# Patient Record
Sex: Female | Born: 1965 | Race: Black or African American | Hispanic: No | Smoking: Never smoker
Health system: Southern US, Community
[De-identification: ages and names within clinical notes are randomized; demographics above are authoritative.]

## PROBLEM LIST (undated history)

## (undated) DIAGNOSIS — F329 Major depressive disorder, single episode, unspecified: Secondary | ICD-10-CM

## (undated) DIAGNOSIS — M1990 Unspecified osteoarthritis, unspecified site: Secondary | ICD-10-CM

## (undated) DIAGNOSIS — E119 Type 2 diabetes mellitus without complications: Secondary | ICD-10-CM

## (undated) DIAGNOSIS — F419 Anxiety disorder, unspecified: Secondary | ICD-10-CM

## (undated) DIAGNOSIS — F32A Depression, unspecified: Secondary | ICD-10-CM

## (undated) HISTORY — DX: Unspecified osteoarthritis, unspecified site: M19.90

## (undated) HISTORY — DX: Anxiety disorder, unspecified: F41.9

## (undated) HISTORY — DX: Depression, unspecified: F32.A

## (undated) HISTORY — DX: Type 2 diabetes mellitus without complications: E11.9

## (undated) HISTORY — DX: Major depressive disorder, single episode, unspecified: F32.9

---

## 2004-10-15 ENCOUNTER — Other Ambulatory Visit: Admission: RE | Admit: 2004-10-15 | Discharge: 2004-10-15 | Payer: Self-pay | Admitting: Gynecology

## 2005-01-24 ENCOUNTER — Ambulatory Visit: Payer: Self-pay | Admitting: Obstetrics and Gynecology

## 2005-01-24 ENCOUNTER — Inpatient Hospital Stay (HOSPITAL_COMMUNITY): Admission: AD | Admit: 2005-01-24 | Discharge: 2005-01-24 | Payer: Self-pay | Admitting: Gynecology

## 2005-03-02 ENCOUNTER — Other Ambulatory Visit: Admission: RE | Admit: 2005-03-02 | Discharge: 2005-03-02 | Payer: Self-pay | Admitting: Gynecology

## 2005-05-30 ENCOUNTER — Encounter: Admission: RE | Admit: 2005-05-30 | Discharge: 2005-08-28 | Payer: Self-pay | Admitting: Gynecology

## 2005-09-01 ENCOUNTER — Inpatient Hospital Stay (HOSPITAL_COMMUNITY): Admission: AD | Admit: 2005-09-01 | Discharge: 2005-09-04 | Payer: Self-pay | Admitting: Gynecology

## 2005-09-02 ENCOUNTER — Encounter (INDEPENDENT_AMBULATORY_CARE_PROVIDER_SITE_OTHER): Payer: Self-pay | Admitting: *Deleted

## 2005-09-05 ENCOUNTER — Encounter: Admission: RE | Admit: 2005-09-05 | Discharge: 2005-10-05 | Payer: Self-pay | Admitting: Gynecology

## 2005-10-06 ENCOUNTER — Encounter: Admission: RE | Admit: 2005-10-06 | Discharge: 2005-11-04 | Payer: Self-pay | Admitting: Gynecology

## 2005-10-12 ENCOUNTER — Other Ambulatory Visit: Admission: RE | Admit: 2005-10-12 | Discharge: 2005-10-12 | Payer: Self-pay | Admitting: Gynecology

## 2005-11-05 ENCOUNTER — Encounter: Admission: RE | Admit: 2005-11-05 | Discharge: 2005-12-05 | Payer: Self-pay | Admitting: Gynecology

## 2005-12-06 ENCOUNTER — Encounter: Admission: RE | Admit: 2005-12-06 | Discharge: 2005-12-08 | Payer: Self-pay | Admitting: Gynecology

## 2006-10-16 ENCOUNTER — Other Ambulatory Visit: Admission: RE | Admit: 2006-10-16 | Discharge: 2006-10-16 | Payer: Self-pay | Admitting: Gynecology

## 2007-10-17 ENCOUNTER — Other Ambulatory Visit: Admission: RE | Admit: 2007-10-17 | Discharge: 2007-10-17 | Payer: Self-pay | Admitting: Gynecology

## 2008-01-12 ENCOUNTER — Emergency Department (HOSPITAL_COMMUNITY): Admission: EM | Admit: 2008-01-12 | Discharge: 2008-01-12 | Payer: Self-pay | Admitting: Emergency Medicine

## 2008-06-03 ENCOUNTER — Encounter: Admission: RE | Admit: 2008-06-03 | Discharge: 2008-06-03 | Payer: Self-pay | Admitting: Gynecology

## 2008-06-05 ENCOUNTER — Encounter: Admission: RE | Admit: 2008-06-05 | Discharge: 2008-06-05 | Payer: Self-pay | Admitting: Gynecology

## 2008-06-23 ENCOUNTER — Emergency Department (HOSPITAL_COMMUNITY): Admission: EM | Admit: 2008-06-23 | Discharge: 2008-06-23 | Payer: Self-pay | Admitting: Family Medicine

## 2009-01-15 ENCOUNTER — Ambulatory Visit: Payer: Self-pay | Admitting: Gynecology

## 2009-01-15 ENCOUNTER — Other Ambulatory Visit: Admission: RE | Admit: 2009-01-15 | Discharge: 2009-01-15 | Payer: Self-pay | Admitting: Gynecology

## 2009-01-15 ENCOUNTER — Encounter: Payer: Self-pay | Admitting: Gynecology

## 2009-03-11 ENCOUNTER — Ambulatory Visit: Payer: Self-pay | Admitting: Gynecology

## 2010-01-15 ENCOUNTER — Other Ambulatory Visit: Admission: RE | Admit: 2010-01-15 | Discharge: 2010-01-15 | Payer: Self-pay | Admitting: Gynecology

## 2010-01-15 ENCOUNTER — Ambulatory Visit: Payer: Self-pay | Admitting: Gynecology

## 2010-05-09 ENCOUNTER — Encounter: Payer: Self-pay | Admitting: Gynecology

## 2010-07-05 ENCOUNTER — Ambulatory Visit (INDEPENDENT_AMBULATORY_CARE_PROVIDER_SITE_OTHER): Payer: BC Managed Care – PPO | Admitting: Gynecology

## 2010-07-05 DIAGNOSIS — L259 Unspecified contact dermatitis, unspecified cause: Secondary | ICD-10-CM

## 2010-07-15 ENCOUNTER — Ambulatory Visit (INDEPENDENT_AMBULATORY_CARE_PROVIDER_SITE_OTHER): Payer: BC Managed Care – PPO | Admitting: Gynecology

## 2010-07-15 DIAGNOSIS — L259 Unspecified contact dermatitis, unspecified cause: Secondary | ICD-10-CM

## 2010-07-19 ENCOUNTER — Ambulatory Visit: Payer: BC Managed Care – PPO | Admitting: Gynecology

## 2010-09-03 NOTE — H&P (Signed)
NAME:  Debbie Williams, Debbie Williams   ACCOUNT NO.:  [id number]   MEDICAL RECORD NO.:  [id number]          PATIENT TYPE:  MAT   LOCATION:  [facility]                          FACILITY:  [facility]   PHYSICIAN:  Timothy P. Fontaine, M.D.DATE OF BIRTH:  [birth year]   DATE OF ADMISSION:  DATE OF DISCHARGE:                                HISTORY & PHYSICAL   CHIEF COMPLAINT:  1.  Pregnancy at 38 weeks.  2.  Gestational diabetes.  3.  Marginal low AFI.   HISTORY OF PRESENT ILLNESS:  [name], [name] at [redacted] weeks  gestation, being followed for gestational diabetes. Good glucose control.  Initially had polyhydramnios during the pregnancy and now has marginal  oligohydramnios over the past 2 weeks. She has been treated with bedrest and  fluid hydration. She is admitted at this time for induction due to her  advancing pregnancy and marginal AFI at 8 cm, prior polyhydramnios, and her  gestational diabetes. For the remainder of her history, see her [facility].   PHYSICAL EXAMINATION:  HEENT:  Normal.  LUNGS:  Clear.  CARDIAC:  Regular rate. No murmur, rub, or gallop.  ABDOMEN:  Gravid, vertex fetus consistent with term. Positive fetal heart  tones.  PELVIC:  Cervix is closed, 50%, minus 2 station, vertex presentation.   ASSESSMENT/PLAN:  [name], P0 with gestation diabetes, initially  with polyhydramnios, now with marginal oligohydramnios despite bedrest,  fluid hydration, for induction. She is beta strep negative. The risks,  benefits, indications, and alternatives for the induction were reviewed with  her. Given the unfavorable status, the issues of possible cesarean was also  reviewed. The patient strongly desires induction and is very nervous about  prolonging her pregnancy in anticipation of waiting for a favorable cervix  or spontaneous labor and she is admitted at this time for Cervidil in the  p.m. with pitocin in the a.m.      Timothy P. Fontaine, M.D.  Electronically  Signed     TPF/MEDQ  D:  09/01/2005  T:  09/01/2005  Job:  [id number]

## 2010-09-20 ENCOUNTER — Ambulatory Visit (INDEPENDENT_AMBULATORY_CARE_PROVIDER_SITE_OTHER): Payer: BC Managed Care – PPO | Admitting: Gynecology

## 2010-09-20 DIAGNOSIS — N63 Unspecified lump in unspecified breast: Secondary | ICD-10-CM

## 2010-10-13 ENCOUNTER — Inpatient Hospital Stay (INDEPENDENT_AMBULATORY_CARE_PROVIDER_SITE_OTHER)
Admission: RE | Admit: 2010-10-13 | Discharge: 2010-10-13 | Disposition: A | Discharge status: Home / Self Care | Payer: BC Managed Care – PPO | Source: Ambulatory Visit | Attending: Emergency Medicine | Admitting: Emergency Medicine

## 2010-10-13 DIAGNOSIS — R002 Palpitations: Secondary | ICD-10-CM

## 2010-10-13 DIAGNOSIS — F411 Generalized anxiety disorder: Secondary | ICD-10-CM

## 2011-01-17 ENCOUNTER — Encounter: Payer: Self-pay | Admitting: Gynecology

## 2011-05-24 ENCOUNTER — Other Ambulatory Visit: Payer: Self-pay | Admitting: *Deleted

## 2011-06-12 ENCOUNTER — Other Ambulatory Visit: Payer: Self-pay | Admitting: Internal Medicine

## 2011-06-12 MED ORDER — SERTRALINE HCL 100 MG PO TABS: 100.0000 mg | ORAL_TABLET | Freq: Every day | ORAL | Status: DC

## 2011-09-13 ENCOUNTER — Ambulatory Visit (INDEPENDENT_AMBULATORY_CARE_PROVIDER_SITE_OTHER): Payer: BC Managed Care – PPO | Admitting: Gynecology

## 2011-09-13 ENCOUNTER — Encounter: Payer: Self-pay | Admitting: Gynecology

## 2011-09-13 VITALS — BP 126/70 | Ht 66.5 in | Wt 208.0 lb

## 2011-09-13 DIAGNOSIS — Z30431 Encounter for routine checking of intrauterine contraceptive device: Secondary | ICD-10-CM

## 2011-09-13 DIAGNOSIS — Z01419 Encounter for gynecological examination (general) (routine) without abnormal findings: Secondary | ICD-10-CM

## 2011-09-13 NOTE — Progress Notes (Signed)
Debbie Williams Apr 05, 1966 [id number]        46 y.o.  for annual exam.  Several issues noted below.  Past medical history,surgical history, medications, allergies, family history and social history were all reviewed and documented in the EPIC chart. ROS:  Was performed and pertinent positives and negatives are included in the history.  Exam: Sherrilyn Rist chaperone present Filed Vitals:   09/13/11 0953  BP: 126/70   General appearance  Normal Skin grossly normal Head/Neck normal with no cervical or supraclavicular adenopathy thyroid normal Lungs  clear Cardiac RR, without RMG Abdominal  soft, nontender, without masses, organomegaly or hernia Breasts  examined lying and sitting without masses, retractions, discharge or axillary adenopathy. Pelvic  Ext/BUS/vagina  normal   Cervix  normal IUD string visualized  Uterus  anteverted, normal size, shape and contour, midline and mobile nontender   Adnexa  Without masses or tenderness    Anus and perineum  normal   Rectovaginal  normal sphincter tone without palpated masses or tenderness.    Assessment/Plan:  46 y.o. female for annual exam.    1. IUD. Rate IUD placed June 2009. Due to be replaced June 2014. Patient is to follow up before then to have it replaced. Patient doing well with that with scant to absent menses. 2. Mammography. Mammogram October 2012 normal. We'll continue with annual mammography. SBE monthly reviewed. No self-reported abnormalities on SBE. 3. Pap smear. No Pap smear done today. Last Pap smear September 2011. No history of abnormal Pap smears before. Discussed current screening guidelines and we'll plan every 3-5 your repeat. 4. Anxiety/depression. Patient is on medical disability currently being actively followed by a psychiatrist will continue to see them. 5. Health maintenance.  No blood work was done today as this all done through her primary physician's whom she actively sees. Assuming she continues well from a  gynecologic standpoint she will see me in a year and knows to have her IUD switched by June 2014.    Dara Lords MD, 10:53 AM 09/13/2011

## 2011-09-13 NOTE — Patient Instructions (Signed)
Follow up in one year for your annual exam and replacement of your IUD.

## 2012-02-28 ENCOUNTER — Encounter: Payer: Self-pay | Admitting: Gynecology

## 2012-06-24 ENCOUNTER — Ambulatory Visit (INDEPENDENT_AMBULATORY_CARE_PROVIDER_SITE_OTHER): Payer: BC Managed Care – PPO | Admitting: Internal Medicine

## 2012-06-24 VITALS — BP 117/80 | HR 94 | Temp 98.2°F | Resp 16 | Ht 67.0 in | Wt 226.0 lb

## 2012-06-24 DIAGNOSIS — T148XXA Other injury of unspecified body region, initial encounter: Secondary | ICD-10-CM

## 2012-06-24 NOTE — Progress Notes (Signed)
  Subjective:    Patient ID: Debbie Williams, female    DOB: 01-01-1966, 47 y.o.   MRN: [id number]  HPI lots of stray cats being fed next door Came to her back porch during snow, looking for food In the dark she reached to shoo them and one cat nipped her finger She didn't bleed and cannot see wound site but wants to be checked Neither animal control for [place] cat group will help    Review of Systems     Objective:   Physical Exam vs stable Extremities examined and there is no sign of bite wound       Assessment & Plan:  Problem 1  Cat bite-reassured no risk of infection

## 2012-09-16 HISTORY — PX: INTRAUTERINE DEVICE INSERTION: SHX323

## 2012-09-20 ENCOUNTER — Encounter: Payer: Self-pay | Admitting: Gynecology

## 2012-09-20 ENCOUNTER — Other Ambulatory Visit (HOSPITAL_COMMUNITY)
Admission: RE | Admit: 2012-09-20 | Discharge: 2012-09-20 | Disposition: A | Discharge status: Home / Self Care | Payer: BC Managed Care – PPO | Source: Ambulatory Visit | Attending: Gynecology | Admitting: Gynecology

## 2012-09-20 ENCOUNTER — Ambulatory Visit (INDEPENDENT_AMBULATORY_CARE_PROVIDER_SITE_OTHER): Payer: BC Managed Care – PPO | Admitting: Gynecology

## 2012-09-20 VITALS — BP 128/82 | Ht 66.0 in | Wt 235.0 lb

## 2012-09-20 DIAGNOSIS — Z30431 Encounter for routine checking of intrauterine contraceptive device: Secondary | ICD-10-CM

## 2012-09-20 DIAGNOSIS — Z1151 Encounter for screening for human papillomavirus (HPV): Secondary | ICD-10-CM | POA: Insufficient documentation

## 2012-09-20 DIAGNOSIS — Z13220 Encounter for screening for lipoid disorders: Secondary | ICD-10-CM

## 2012-09-20 DIAGNOSIS — Z01419 Encounter for gynecological examination (general) (routine) without abnormal findings: Secondary | ICD-10-CM | POA: Insufficient documentation

## 2012-09-20 LAB — COMPREHENSIVE METABOLIC PANEL
ALT: 25 U/L (ref 0–35)
AST: 22 U/L (ref 0–37)
Calcium: 10 mg/dL (ref 8.4–10.5)
Chloride: 105 mEq/L (ref 96–112)
Creat: 0.73 mg/dL (ref 0.50–1.10)
Potassium: 4.2 mEq/L (ref 3.5–5.3)

## 2012-09-20 LAB — LIPID PANEL: Total CHOL/HDL Ratio: 4.2 Ratio

## 2012-09-20 LAB — CBC WITH DIFFERENTIAL/PLATELET
Basophils Absolute: 0 10*3/uL (ref 0.0–0.1)
Eosinophils Relative: 3 % (ref 0–5)
Lymphocytes Relative: 47 % — ABNORMAL HIGH (ref 12–46)
Neutro Abs: 2.9 10*3/uL (ref 1.7–7.7)
Neutrophils Relative %: 42 % — ABNORMAL LOW (ref 43–77)
Platelets: 441 10*3/uL — ABNORMAL HIGH (ref 150–400)
RDW: 13.7 % (ref 11.5–15.5)
WBC: 7 10*3/uL (ref 4.0–10.5)

## 2012-09-20 NOTE — Progress Notes (Signed)
Debbie Williams Aug 02, 1965 [id number]        47 y.o.  [id number] for annual exam.  Several issues noted below.  Past medical history,surgical history, medications, allergies, family history and social history were all reviewed and documented in the EPIC chart.  ROS:  Performed and pertinent positives and negatives are included in the history, assessment and plan .  Exam: Biomedical scientist Filed Vitals:   09/20/12 1056  BP: 128/82  Height: 5\' 6"  (1.676 m)  Weight: 235 lb (106.595 kg)   General appearance  Normal Skin grossly normal Head/Neck normal with no cervical or supraclavicular adenopathy thyroid normal Lungs  clear Cardiac RR, without RMG Abdominal  soft, nontender, without masses, organomegaly or hernia Breasts  examined lying and sitting without masses, retractions, discharge or axillary adenopathy. Pelvic  Ext/BUS/vagina  normal   Cervix  normal IUD string visualized. Pap/HPV  Uterus  anteverted, normal size, shape and contour, midline and mobile nontender   Adnexa  Without masses or tenderness    Anus and perineum  normal   Rectovaginal  normal sphincter tone without palpated masses or tenderness.    Assessment/Plan:  47 y.o. [id number] female for annual exam.   1. Mirena IUD 09/2007. Due to be replaced now the patient can return to have this done. Stressed the need to have this done the patient understands. Amenorrheic on the IUD otherwise doing well. 2. Mammography 02/2012. Continued annual mammography. SBE monthly review. 3. Pap smear 2011. Pap/HPV done today. No history of abnormal Pap smears previously. 4. Health maintenance. Baseline CBC comprehensive metabolic panel lipid profile urinalysis done. Patient is followed at [facility] urgent medical care and asked to have her lab work done now to be available to them. Followup for IUD replacement.    Dara Lords MD, 11:34 AM 09/20/2012

## 2012-09-20 NOTE — Addendum Note (Signed)
Addended by: Bertram Savin A on: 09/20/2012 12:29 PM   Modules accepted: Orders

## 2012-09-20 NOTE — Patient Instructions (Addendum)
Follow up for IUD replacement  Intrauterine Device Insertion Most often, an intrauterine device (IUD) is inserted into the uterus to prevent pregnancy. There are 2 types of IUDs available:  Copper IUD. This type of IUD creates an environment that is not favorable to sperm survival. The mechanism of action of the copper IUD is not known for certain. It can stay in place for 10 years.  Hormone IUD. This type of IUD contains the hormone progestin (synthetic progesterone). The progestin thickens the cervical mucus and prevents sperm from entering the uterus, and it also thins the uterine lining. There is no evidence that the hormone IUD prevents implantation. The hormone IUD can stay in place for up to 5 years. An IUD is the most cost-effective birth control if left in place for the full duration. It may be removed at any time. LET YOUR CAREGIVER KNOW ABOUT:  Sensitivity to metals.  Medicines taken including herbs, eyedrops, over-the-counter medicines, and creams.  Use of steroids (by mouth or creams).  Previous problems with anesthetics or numbing medicine.  Previous gynecological surgery.  History of blood clots or clotting disorders.  Possibility of pregnancy.  Menstrual irregularities.  Concerns regarding unusual vaginal discharge or odors.  Previous experience with an IUD.  Other health problems. RISKS AND COMPLICATIONS  Accidental puncture (perforation) of the uterus.  Accidental placement of the IUD either in the muscle layer of the uterus (myometrium) or outside the uterus. If this happen, the IUD can be found essentially floating around the bowels. When this happens, the IUD must be taken out surgically.  The IUD may fall out of the uterus (expulsion). This is more common in women who have recently had a child.   Pregnancy in the fallopian tube (ectopic). BEFORE THE PROCEDURE  Schedule the IUD insertion for when you will have your menstrual period or right after, to  make sure you are not pregnant. Placement of the IUD is better tolerated shortly after a menstrual cycle.  You may need to take tests or be examined to make sure you are not pregnant.  You may be required to take a pregnancy test.  You may be required to get checked for sexually transmitted infections (STIs) prior to placement. Placing an IUD in someone who has an infection can make an infection worse.  You may be given a pain reliever to take 1 or 2 hours before the procedure.  An exam will be performed to determine the size and position of your uterus.  Ask your caregiver about changing or stopping your regular medicines. PROCEDURE   A tool (speculum) is placed in the vagina. This allows your caregiver to see the lower part of the uterus (cervix).  The cervix is prepped with a medicine that lowers the risk of infection.  You may be given a medicine to numb each side of the cervix (intracervical or paracervical block). This is used to block and control any discomfort with insertion.  A tool (uterine sound) is inserted into the uterus to determine the length of the uterine cavity and the direction the uterus may be tilted.  A slim instrument (IUD inserter) is inserted through the cervical canal and into your uterus.  The IUD is placed in the uterine cavity and the insertion device is removed.  The nylon string that is attached to the IUD, and used for eventual IUD removal, is trimmed. It is trimmed so that it lays high in the vagina, just outside the cervix. AFTER THE PROCEDURE  You may have bleeding after the procedure. This is normal. It varies from light spotting for a few days to menstrual-like bleeding.  You may have mild cramping.  Practice checking the string coming out of the cervix to make sure the IUD remains in the uterus. If you cannot feel the string, you should schedule a "string check" with your caregiver.  If you had a hormone IUD inserted, expect that your period  may be lighter or nonexistent within a year's time (though this is not always the case). There may be delayed fertility with the hormone IUD as a result of its progesterone effect. When you are ready to become pregnant, it is suggested to have the IUD removed up to 1 year in advance.  Yearly exams are advised. Document Released: 12/01/2010 Document Revised: 06/27/2011 Document Reviewed: 12/01/2010 [facility] Patient Information 2014 [place], [place].

## 2012-09-20 NOTE — Progress Notes (Deleted)
NO MENSES DUE TO IUD 

## 2012-09-21 ENCOUNTER — Other Ambulatory Visit: Payer: Self-pay | Admitting: Gynecology

## 2012-09-21 DIAGNOSIS — E785 Hyperlipidemia, unspecified: Secondary | ICD-10-CM

## 2012-09-21 DIAGNOSIS — D649 Anemia, unspecified: Secondary | ICD-10-CM

## 2012-09-21 LAB — URINALYSIS W MICROSCOPIC + REFLEX CULTURE
Glucose, UA: NEGATIVE mg/dL
Nitrite: NEGATIVE
Protein, ur: NEGATIVE mg/dL
Urobilinogen, UA: 0.2 mg/dL (ref 0.0–1.0)

## 2012-09-22 LAB — URINE CULTURE: Organism ID, Bacteria: NO GROWTH

## 2012-09-25 ENCOUNTER — Telehealth: Payer: Self-pay | Admitting: Gynecology

## 2012-09-25 ENCOUNTER — Other Ambulatory Visit: Payer: Self-pay | Admitting: Gynecology

## 2012-09-25 DIAGNOSIS — Z3049 Encounter for surveillance of other contraceptives: Secondary | ICD-10-CM

## 2012-09-25 MED ORDER — LEVONORGESTREL 20 MCG/24HR IU IUD: INTRAUTERINE_SYSTEM | Freq: Once | INTRAUTERINE | Status: DC

## 2012-09-25 NOTE — Telephone Encounter (Signed)
09/25/12-Pt was informed today that her BC ins covers the Mirena,removal of old & insertion of new at 100%, no copay. She has appt with TF for 10/09/12-WL

## 2012-10-03 ENCOUNTER — Telehealth: Payer: Self-pay | Admitting: *Deleted

## 2012-10-03 NOTE — Telephone Encounter (Signed)
Pt informed with pap results on 09/20/12

## 2012-10-09 ENCOUNTER — Encounter: Payer: Self-pay | Admitting: Gynecology

## 2012-10-09 ENCOUNTER — Ambulatory Visit (INDEPENDENT_AMBULATORY_CARE_PROVIDER_SITE_OTHER): Payer: BC Managed Care – PPO | Admitting: Gynecology

## 2012-10-09 DIAGNOSIS — Z30432 Encounter for removal of intrauterine contraceptive device: Secondary | ICD-10-CM

## 2012-10-09 DIAGNOSIS — Z30431 Encounter for routine checking of intrauterine contraceptive device: Secondary | ICD-10-CM

## 2012-10-09 DIAGNOSIS — D649 Anemia, unspecified: Secondary | ICD-10-CM

## 2012-10-09 DIAGNOSIS — E785 Hyperlipidemia, unspecified: Secondary | ICD-10-CM

## 2012-10-09 LAB — CBC WITH DIFFERENTIAL/PLATELET
Basophils Absolute: 0 10*3/uL (ref 0.0–0.1)
Lymphocytes Relative: 49 % — ABNORMAL HIGH (ref 12–46)
Lymphs Abs: 3.4 10*3/uL (ref 0.7–4.0)
Neutrophils Relative %: 41 % — ABNORMAL LOW (ref 43–77)
Platelets: 412 10*3/uL — ABNORMAL HIGH (ref 150–400)
RBC: 4.12 MIL/uL (ref 3.87–5.11)
WBC: 7 10*3/uL (ref 4.0–10.5)

## 2012-10-09 NOTE — Patient Instructions (Signed)
Intrauterine Device Insertion Most often, an intrauterine device (IUD) is inserted into the uterus to prevent pregnancy. There are 2 types of IUDs available:  Copper IUD. This type of IUD creates an environment that is not favorable to sperm survival. The mechanism of action of the copper IUD is not known for certain. It can stay in place for 10 years.  Hormone IUD. This type of IUD contains the hormone progestin (synthetic progesterone). The progestin thickens the cervical mucus and prevents sperm from entering the uterus, and it also thins the uterine lining. There is no evidence that the hormone IUD prevents implantation. The hormone IUD can stay in place for up to 5 years. An IUD is the most cost-effective birth control if left in place for the full duration. It may be removed at any time. LET YOUR CAREGIVER KNOW ABOUT:  Sensitivity to metals.  Medicines taken including herbs, eyedrops, over-the-counter medicines, and creams.  Use of steroids (by mouth or creams).  Previous problems with anesthetics or numbing medicine.  Previous gynecological surgery.  History of blood clots or clotting disorders.  Possibility of pregnancy.  Menstrual irregularities.  Concerns regarding unusual vaginal discharge or odors.  Previous experience with an IUD.  Other health problems. RISKS AND COMPLICATIONS  Accidental puncture (perforation) of the uterus.  Accidental placement of the IUD either in the muscle layer of the uterus (myometrium) or outside the uterus. If this happen, the IUD can be found essentially floating around the bowels. When this happens, the IUD must be taken out surgically.  The IUD may fall out of the uterus (expulsion). This is more common in women who have recently had a child.   Pregnancy in the fallopian tube (ectopic). BEFORE THE PROCEDURE  Schedule the IUD insertion for when you will have your menstrual period or right after, to make sure you are not pregnant.  Placement of the IUD is better tolerated shortly after a menstrual cycle.  You may need to take tests or be examined to make sure you are not pregnant.  You may be required to take a pregnancy test.  You may be required to get checked for sexually transmitted infections (STIs) prior to placement. Placing an IUD in someone who has an infection can make an infection worse.  You may be given a pain reliever to take 1 or 2 hours before the procedure.  An exam will be performed to determine the size and position of your uterus.  Ask your caregiver about changing or stopping your regular medicines. PROCEDURE   A tool (speculum) is placed in the vagina. This allows your caregiver to see the lower part of the uterus (cervix).  The cervix is prepped with a medicine that lowers the risk of infection.  You may be given a medicine to numb each side of the cervix (intracervical or paracervical block). This is used to block and control any discomfort with insertion.  A tool (uterine sound) is inserted into the uterus to determine the length of the uterine cavity and the direction the uterus may be tilted.  A slim instrument (IUD inserter) is inserted through the cervical canal and into your uterus.  The IUD is placed in the uterine cavity and the insertion device is removed.  The nylon string that is attached to the IUD, and used for eventual IUD removal, is trimmed. It is trimmed so that it lays high in the vagina, just outside the cervix. AFTER THE PROCEDURE  You may have bleeding after the   procedure. This is normal. It varies from light spotting for a few days to menstrual-like bleeding.  You may have mild cramping.  Practice checking the string coming out of the cervix to make sure the IUD remains in the uterus. If you cannot feel the string, you should schedule a "string check" with your caregiver.  If you had a hormone IUD inserted, expect that your period may be lighter or nonexistent  within a year's time (though this is not always the case). There may be delayed fertility with the hormone IUD as a result of its progesterone effect. When you are ready to become pregnant, it is suggested to have the IUD removed up to 1 year in advance.  Yearly exams are advised. Document Released: 12/01/2010 Document Revised: 06/27/2011 Document Reviewed: 12/01/2010 [employer] Patient Information 2014 [employer].  

## 2012-10-09 NOTE — Progress Notes (Signed)
Patient presents for Mirena IUD removal and replacement. She has read through the booklet, has no contraindications and signed the consent form.  I reviewed the removal and insertional process with her as well as the risks to include infection either immediate or long-term, uterine perforation or migration requiring surgery to remove, other complications such as pain, hormonal side effects and possibilities of failure with subsequent pregnancy.   Exam with Kim assistant Pelvic: External BUS vagina normal. Cervix normal with IUD string visualized. Uterus retroverted normal size shape contour midline mobile nontender. Adnexa without masses or tenderness.  Procedure: The cervix was visualized and the IUD string was grasped with a [place] forcep and her old Mirena IUD was removed, shown to the patient and discarded. The cervix was then cleansed with Betadine, anterior lip grasped with a single-tooth tenaculum, the uterus was sounded and a new Mirena IUD was placed according to manufacturer's recommendations without difficulty. The strings were trimmed. The patient tolerated well and will follow up in one month for a postinsertional check.  Lot number:  [id number]

## 2012-10-10 LAB — IRON: Iron: 74 ug/dL (ref 42–145)

## 2012-10-10 LAB — FERRITIN: Ferritin: 128 ng/mL (ref 10–291)

## 2012-10-10 LAB — LIPID PANEL
Cholesterol: 250 mg/dL — ABNORMAL HIGH (ref 0–200)
HDL: 55 mg/dL (ref >39)
Total CHOL/HDL Ratio: 4.5 Ratio

## 2012-11-08 ENCOUNTER — Encounter: Payer: Self-pay | Admitting: Gynecology

## 2012-11-08 ENCOUNTER — Ambulatory Visit (INDEPENDENT_AMBULATORY_CARE_PROVIDER_SITE_OTHER): Payer: BC Managed Care – PPO | Admitting: Gynecology

## 2012-11-08 DIAGNOSIS — E7800 Pure hypercholesterolemia, unspecified: Secondary | ICD-10-CM

## 2012-11-08 DIAGNOSIS — Z30431 Encounter for routine checking of intrauterine contraceptive device: Secondary | ICD-10-CM

## 2012-11-08 DIAGNOSIS — D649 Anemia, unspecified: Secondary | ICD-10-CM

## 2012-11-08 NOTE — Patient Instructions (Signed)
Increase exercise, decrease fat in your diet and lose some weight and this will all help your cholesterol. Start a multivitamin with iron. Repeat fasting lipid profile and CBC in 3-6 months.

## 2012-11-08 NOTE — Progress Notes (Signed)
Patient presents for followup IUD check exam. Doing well without complaints.  Exam with Berenice Bouton External BUS vagina normal. Cervix normal with IUD string visualized an appropriate length. Uterus retroverted normal size, mobile nontender. Adnexa without masses or tenderness.  Assessment and plan: 1. Mirena IUD. Doing well. 2. Reviewed fasting lipid profile with cholesterol 250 triglycerides 167 and LDL 162. Options for medication treatment versus behavior modification reviewed. Patient wants to try exercising and losing some weight, decrease fat in her diet and we'll see how this does. It would continue high then she will need to consider medical treatment. She'll repeat a fasting lipid profile in 3-6 months. 3. Anemia with hemoglobin 11 microcytic hyperchromic indices. Is sickle negative. Does not take iron or multivitamin. Is having scant menses. Recommend multivitamin with iron. No GI symptomatology. If anemia persists may consider hematology evaluation.

## 2013-01-09 ENCOUNTER — Other Ambulatory Visit: Payer: Self-pay | Admitting: *Deleted

## 2013-01-09 DIAGNOSIS — E782 Mixed hyperlipidemia: Secondary | ICD-10-CM

## 2013-01-09 DIAGNOSIS — D649 Anemia, unspecified: Secondary | ICD-10-CM

## 2013-01-31 ENCOUNTER — Encounter: Payer: Self-pay | Admitting: Gynecology

## 2013-02-21 ENCOUNTER — Other Ambulatory Visit: Payer: Self-pay

## 2013-10-15 ENCOUNTER — Ambulatory Visit (INDEPENDENT_AMBULATORY_CARE_PROVIDER_SITE_OTHER): Payer: BC Managed Care – PPO | Admitting: Gynecology

## 2013-10-15 ENCOUNTER — Encounter: Payer: Self-pay | Admitting: Gynecology

## 2013-10-15 VITALS — BP 134/80 | Ht 66.0 in | Wt 238.0 lb

## 2013-10-15 DIAGNOSIS — Z01419 Encounter for gynecological examination (general) (routine) without abnormal findings: Secondary | ICD-10-CM

## 2013-10-15 DIAGNOSIS — Z30431 Encounter for routine checking of intrauterine contraceptive device: Secondary | ICD-10-CM

## 2013-10-15 LAB — CBC WITH DIFFERENTIAL/PLATELET
BASOS ABS: 0.1 10*3/uL (ref 0.0–0.1)
BASOS PCT: 1 % (ref 0–1)
Eosinophils Absolute: 0.2 10*3/uL (ref 0.0–0.7)
Eosinophils Relative: 3 % (ref 0–5)
HCT: 34.9 % — ABNORMAL LOW (ref 36.0–46.0)
HEMOGLOBIN: 11.9 g/dL — AB (ref 12.0–15.0)
LYMPHS PCT: 48 % — AB (ref 12–46)
Lymphs Abs: 2.8 10*3/uL (ref 0.7–4.0)
MCH: 27.2 pg (ref 26.0–34.0)
MCHC: 34.1 g/dL (ref 30.0–36.0)
MCV: 79.9 fL (ref 78.0–100.0)
MONOS PCT: 6 % (ref 3–12)
Monocytes Absolute: 0.4 10*3/uL (ref 0.1–1.0)
NEUTROS ABS: 2.5 10*3/uL (ref 1.7–7.7)
NEUTROS PCT: 42 % — AB (ref 43–77)
Platelets: 473 10*3/uL — ABNORMAL HIGH (ref 150–400)
RBC: 4.37 MIL/uL (ref 3.87–5.11)
RDW: 14 % (ref 11.5–15.5)
WBC: 5.9 10*3/uL (ref 4.0–10.5)

## 2013-10-15 NOTE — Patient Instructions (Signed)
Followup for your lab results. If you're cholesterol remains elevated you need to followup with her primary doctors for treatment.  Followup in one year for annual exam.  You may obtain a copy of any labs that were done today by logging onto MyChart as outlined in the instructions provided with your AVS (after visit summary). The office will not call with normal lab results but certainly if there are any significant abnormalities then we will contact you.   Health Maintenance, Female A healthy lifestyle and preventative care can promote health and wellness.  Maintain regular health, dental, and eye exams.  Eat a healthy diet. Foods like vegetables, fruits, whole grains, low-fat dairy products, and lean protein foods contain the nutrients you need without too many calories. Decrease your intake of foods high in solid fats, added sugars, and salt. Get information about a proper diet from your caregiver, if necessary.  Regular physical exercise is one of the most important things you can do for your health. Most adults should get at least 150 minutes of moderate-intensity exercise (any activity that increases your heart rate and causes you to sweat) each week. In addition, most adults need muscle-strengthening exercises on 2 or more days a week.   Maintain a healthy weight. The body mass index (BMI) is a screening tool to identify possible weight problems. It provides an estimate of body fat based on height and weight. Your caregiver can help determine your BMI, and can help you achieve or maintain a healthy weight. For adults 20 years and older:  A BMI below 18.5 is considered underweight.  A BMI of 18.5 to 24.9 is normal.  A BMI of 25 to 29.9 is considered overweight.  A BMI of 30 and above is considered obese.  Maintain normal blood lipids and cholesterol by exercising and minimizing your intake of saturated fat. Eat a balanced diet with plenty of fruits and vegetables. Blood tests for  lipids and cholesterol should begin at age 76 and be repeated every 5 years. If your lipid or cholesterol levels are high, you are over 50, or you are a high risk for heart disease, you may need your cholesterol levels checked more frequently.Ongoing high lipid and cholesterol levels should be treated with medicines if diet and exercise are not effective.  If you smoke, find out from your caregiver how to quit. If you do not use tobacco, do not start.  Lung cancer screening is recommended for adults aged 74 80 years who are at high risk for developing lung cancer because of a history of smoking. Yearly low-dose computed tomography (CT) is recommended for people who have at least a 30-pack-year history of smoking and are a current smoker or have quit within the past 15 years. A pack year of smoking is smoking an average of 1 pack of cigarettes a day for 1 year (for example: 1 pack a day for 30 years or 2 packs a day for 15 years). Yearly screening should continue until the smoker has stopped smoking for at least 15 years. Yearly screening should also be stopped for people who develop a health problem that would prevent them from having lung cancer treatment.  If you are pregnant, do not drink alcohol. If you are breastfeeding, be very cautious about drinking alcohol. If you are not pregnant and choose to drink alcohol, do not exceed 1 drink per day. One drink is considered to be 12 ounces (355 mL) of beer, 5 ounces (148 mL) of wine, or  1.5 ounces (44 mL) of liquor.  Avoid use of street drugs. Do not share needles with anyone. Ask for help if you need support or instructions about stopping the use of drugs.  High blood pressure causes heart disease and increases the risk of stroke. Blood pressure should be checked at least every 1 to 2 years. Ongoing high blood pressure should be treated with medicines, if weight loss and exercise are not effective.  If you are 62 to 49 years old, ask your caregiver if  you should take aspirin to prevent strokes.  Diabetes screening involves taking a blood sample to check your fasting blood sugar level. This should be done once every 3 years, after age 40, if you are within normal weight and without risk factors for diabetes. Testing should be considered at a younger age or be carried out more frequently if you are overweight and have at least 1 risk factor for diabetes.  Breast cancer screening is essential preventative care for women. You should practice "breast self-awareness." This means understanding the normal appearance and feel of your breasts and may include breast self-examination. Any changes detected, no matter how small, should be reported to a caregiver. Women in their 67s and 30s should have a clinical breast exam (CBE) by a caregiver as part of a regular health exam every 1 to 3 years. After age 51, women should have a CBE every year. Starting at age 81, women should consider having a mammogram (breast X-ray) every year. Women who have a family history of breast cancer should talk to their caregiver about genetic screening. Women at a high risk of breast cancer should talk to their caregiver about having an MRI and a mammogram every year.  Breast cancer gene (BRCA)-related cancer risk assessment is recommended for women who have family members with BRCA-related cancers. BRCA-related cancers include breast, ovarian, tubal, and peritoneal cancers. Having family members with these cancers may be associated with an increased risk for harmful changes (mutations) in the breast cancer genes BRCA1 and BRCA2. Results of the assessment will determine the need for genetic counseling and BRCA1 and BRCA2 testing.  The Pap test is a screening test for cervical cancer. Women should have a Pap test starting at age 74. Between ages 47 and 57, Pap tests should be repeated every 2 years. Beginning at age 34, you should have a Pap test every 3 years as long as the past 3 Pap  tests have been normal. If you had a hysterectomy for a problem that was not cancer or a condition that could lead to cancer, then you no longer need Pap tests. If you are between ages 24 and 65, and you have had normal Pap tests going back 10 years, you no longer need Pap tests. If you have had past treatment for cervical cancer or a condition that could lead to cancer, you need Pap tests and screening for cancer for at least 20 years after your treatment. If Pap tests have been discontinued, risk factors (such as a new sexual partner) need to be reassessed to determine if screening should be resumed. Some women have medical problems that increase the chance of getting cervical cancer. In these cases, your caregiver may recommend more frequent screening and Pap tests.  The human papillomavirus (HPV) test is an additional test that may be used for cervical cancer screening. The HPV test looks for the virus that can cause the cell changes on the cervix. The cells collected during the Pap  test can be tested for HPV. The HPV test could be used to screen women aged 19 years and older, and should be used in women of any age who have unclear Pap test results. After the age of 28, women should have HPV testing at the same frequency as a Pap test.  Colorectal cancer can be detected and often prevented. Most routine colorectal cancer screening begins at the age of 47 and continues through age 52. However, your caregiver may recommend screening at an earlier age if you have risk factors for colon cancer. On a yearly basis, your caregiver may provide home test kits to check for hidden blood in the stool. Use of a small camera at the end of a tube, to directly examine the colon (sigmoidoscopy or colonoscopy), can detect the earliest forms of colorectal cancer. Talk to your caregiver about this at age 15, when routine screening begins. Direct examination of the colon should be repeated every 5 to 10 years through age 40,  unless early forms of pre-cancerous polyps or small growths are found.  Hepatitis C blood testing is recommended for all people born from [birth year] through 1966 and any individual with known risks for hepatitis C.  Practice safe sex. Use condoms and avoid high-risk sexual practices to reduce the spread of sexually transmitted infections (STIs). Sexually active women aged 87 and younger should be checked for Chlamydia, which is a common sexually transmitted infection. Older women with new or multiple partners should also be tested for Chlamydia. Testing for other STIs is recommended if you are sexually active and at increased risk.  Osteoporosis is a disease in which the bones lose minerals and strength with aging. This can result in serious bone fractures. The risk of osteoporosis can be identified using a bone density scan. Women ages 33 and over and women at risk for fractures or osteoporosis should discuss screening with their caregivers. Ask your caregiver whether you should be taking a calcium supplement or vitamin D to reduce the rate of osteoporosis.  Menopause can be associated with physical symptoms and risks. Hormone replacement therapy is available to decrease symptoms and risks. You should talk to your caregiver about whether hormone replacement therapy is right for you.  Use sunscreen. Apply sunscreen liberally and repeatedly throughout the day. You should seek shade when your shadow is shorter than you. Protect yourself by wearing long sleeves, pants, a wide-brimmed hat, and sunglasses year round, whenever you are outdoors.  Notify your caregiver of new moles or changes in moles, especially if there is a change in shape or color. Also notify your caregiver if a mole is larger than the size of a pencil eraser.  Stay current with your immunizations. Document Released: 10/18/2010 Document Revised: 07/30/2012 Document Reviewed: 10/18/2010 [facility] Patient Information 2014 [place].

## 2013-10-15 NOTE — Progress Notes (Signed)
Debbie Williams 1966/03/23 [id number]        48 y.o.  [id number] for annual exam.  Several issues noted below.  Past medical history,surgical history, problem list, medications, allergies, family history and social history were all reviewed and documented as reviewed in the EPIC chart.  ROS:  12 system ROS performed with pertinent positives and negatives included in the history, assessment and plan.   Additional significant findings :  None   Exam: Kim assistant Filed Vitals:   10/15/13 0838  BP: 134/80  Height: 5\' 6"  (1.676 m)  Weight: 238 lb (107.956 kg)   General appearance:  Normal affect, orientation and appearance. Skin: Grossly normal HEENT: Without gross lesions.  No cervical or supraclavicular adenopathy. Thyroid normal.  Lungs:  Clear without wheezing, rales or rhonchi Cardiac: RR, without RMG Abdominal:  Soft, nontender, without masses, guarding, rebound, organomegaly or hernia Breasts:  Examined lying and sitting without masses, retractions, discharge or axillary adenopathy. Pelvic:  Ext/BUS/vagina normal  Cervix normal, IUD strings visualized.  Uterus retroverted, normal size, shape and contour, midline and mobile nontender   Adnexa  Without masses or tenderness    Anus and perineum  Normal   Rectovaginal  Normal sphincter tone without palpated masses or tenderness.    Assessment/Plan:  48 y.o. 51 female for annual exam without menses, Mirena IUD.   1. Mirena IUD 09/2012. Doing well without menses. Occasional spotting. Continue to monitor. 2. Arthritis. Patient seeing a rheumatologist to be evaluated for rheumatoid arthritis. Will followup with them for evaluation. 3. Pap/HPV negative  09/2012. No Pap smear done today. No history of abnormal Pap smears. Plan repeat at 3-5 year interval per current screening guidelines. 4. Mammography 09/2013. Continue with annual mammography. SBE monthly review. 5. Health maintenance. Patient never followed up for  evaluation of her elevated cholesterol with her primary. Will recheck labs now she is fasting with CBC comprehensive metabolic panel lipid profile urinalysis TSH. Stressed the importance of followup with her primary for evaluation and treatment of her hypercholesterolemia. Long-term risks to include cardiovascular disease reviewed. Followup in one year, sooner as needed.   Note: This document was prepared with digital dictation and possible smart phrase technology. Any transcriptional errors that result from this process are unintentional.   10/2013 MD, 8:54 AM 10/15/2013

## 2013-10-16 ENCOUNTER — Telehealth: Payer: Self-pay

## 2013-10-16 ENCOUNTER — Other Ambulatory Visit: Payer: Self-pay | Admitting: Gynecology

## 2013-10-16 DIAGNOSIS — R7309 Other abnormal glucose: Secondary | ICD-10-CM

## 2013-10-16 DIAGNOSIS — D649 Anemia, unspecified: Secondary | ICD-10-CM

## 2013-10-16 LAB — COMPREHENSIVE METABOLIC PANEL
ALBUMIN: 4.2 g/dL (ref 3.5–5.2)
ALK PHOS: 64 U/L (ref 39–117)
ALT: 19 U/L (ref 0–35)
AST: 19 U/L (ref 0–37)
BUN: 11 mg/dL (ref 6–23)
CO2: 22 mEq/L (ref 19–32)
Calcium: 9.8 mg/dL (ref 8.4–10.5)
Chloride: 105 mEq/L (ref 96–112)
Creat: 0.8 mg/dL (ref 0.50–1.10)
GLUCOSE: 123 mg/dL — AB (ref 70–99)
POTASSIUM: 4.3 meq/L (ref 3.5–5.3)
SODIUM: 139 meq/L (ref 135–145)
TOTAL PROTEIN: 7.6 g/dL (ref 6.0–8.3)
Total Bilirubin: 0.5 mg/dL (ref 0.2–1.2)

## 2013-10-16 LAB — URINALYSIS W MICROSCOPIC + REFLEX CULTURE
Bacteria, UA: NONE SEEN
Bilirubin Urine: NEGATIVE
CASTS: NONE SEEN
CRYSTALS: NONE SEEN
Glucose, UA: NEGATIVE mg/dL
Hgb urine dipstick: NEGATIVE
Ketones, ur: NEGATIVE mg/dL
LEUKOCYTES UA: NEGATIVE
NITRITE: NEGATIVE
PH: 5 (ref 5.0–8.0)
Protein, ur: NEGATIVE mg/dL
SPECIFIC GRAVITY, URINE: 1.02 (ref 1.005–1.030)
Urobilinogen, UA: 0.2 mg/dL (ref 0.0–1.0)

## 2013-10-16 LAB — TSH: TSH: 1.088 u[IU]/mL (ref 0.350–4.500)

## 2013-10-16 LAB — LIPID PANEL
Cholesterol: 231 mg/dL — ABNORMAL HIGH (ref 0–200)
HDL: 50 mg/dL (ref >39)
LDL CALC: 154 mg/dL — AB (ref 0–99)
Total CHOL/HDL Ratio: 4.6 Ratio
Triglycerides: 136 mg/dL (ref <150)
VLDL: 27 mg/dL (ref 0–40)

## 2013-10-16 NOTE — Telephone Encounter (Signed)
No she was fasting then she does not need to repeat them. I would have her get a copy of her labs from My Chart and taken to her primary physician. Her platelet count is mildly elevated probably because of her anemia and starting a multivitamin with iron would help.

## 2013-10-16 NOTE — Telephone Encounter (Signed)
Message copied by Keenan Bachelor on Wed Oct 16, 2013  2:45 PM ------      Message from: Dara Lords      Created: Wed Oct 16, 2013  7:57 AM       Tell patient:      #1 blood count is borderline. Recommend multivitamin with iron daily and to stay on this and recheck a CBC in 3 months.      #2 glucose is elevated. Recommend repeating a fasting glucose with hemoglobin A1c.      #3 cholesterol and LDL are elevated. Recommend repeating a fasting lipid profile. If it remains elevated she will need to see a primary physician to consider medication to treat this. ------

## 2013-10-16 NOTE — Telephone Encounter (Signed)
Void. Wanted to open encounter to include result note.

## 2013-10-16 NOTE — Telephone Encounter (Signed)
Patient was fasting the day she had her labs done.  Any need to return for FLP?  She said if not she will just go on to PCP now.  Also, patient asked about her platelet count as it was elevated at 473. She is to see Rheumatologist soon and wondered if the arthritis caused that? She asked if any need for concern?

## 2013-10-16 NOTE — Telephone Encounter (Signed)
Patient advised.

## 2013-10-30 ENCOUNTER — Other Ambulatory Visit: Payer: BC Managed Care – PPO

## 2013-10-30 DIAGNOSIS — E782 Mixed hyperlipidemia: Secondary | ICD-10-CM

## 2013-10-30 DIAGNOSIS — R7309 Other abnormal glucose: Secondary | ICD-10-CM

## 2013-10-30 DIAGNOSIS — D649 Anemia, unspecified: Secondary | ICD-10-CM

## 2013-10-30 LAB — CBC WITH DIFFERENTIAL/PLATELET
BASOS PCT: 0 % (ref 0–1)
Basophils Absolute: 0 10*3/uL (ref 0.0–0.1)
Eosinophils Absolute: 0.2 10*3/uL (ref 0.0–0.7)
Eosinophils Relative: 3 % (ref 0–5)
HEMATOCRIT: 34.2 % — AB (ref 36.0–46.0)
HEMOGLOBIN: 11.7 g/dL — AB (ref 12.0–15.0)
Lymphocytes Relative: 47 % — ABNORMAL HIGH (ref 12–46)
Lymphs Abs: 3.6 10*3/uL (ref 0.7–4.0)
MCH: 27.5 pg (ref 26.0–34.0)
MCHC: 34.2 g/dL (ref 30.0–36.0)
MCV: 80.3 fL (ref 78.0–100.0)
MONO ABS: 0.5 10*3/uL (ref 0.1–1.0)
MONOS PCT: 7 % (ref 3–12)
NEUTROS ABS: 3.3 10*3/uL (ref 1.7–7.7)
Neutrophils Relative %: 43 % (ref 43–77)
Platelets: 429 10*3/uL — ABNORMAL HIGH (ref 150–400)
RBC: 4.26 MIL/uL (ref 3.87–5.11)
RDW: 14.2 % (ref 11.5–15.5)
WBC: 7.7 10*3/uL (ref 4.0–10.5)

## 2013-10-30 LAB — HEMOGLOBIN A1C
Hgb A1c MFr Bld: 5.6 % (ref <5.7)
Mean Plasma Glucose: 114 mg/dL (ref <117)

## 2013-10-31 ENCOUNTER — Telehealth: Payer: Self-pay | Admitting: *Deleted

## 2013-10-31 LAB — GLUCOSE, FASTING: Glucose, Fasting: 111 mg/dL — ABNORMAL HIGH (ref 70–99)

## 2013-10-31 LAB — LIPID PANEL
Cholesterol: 211 mg/dL — ABNORMAL HIGH (ref 0–200)
HDL: 55 mg/dL (ref >39)
LDL CALC: 128 mg/dL — AB (ref 0–99)
TRIGLYCERIDES: 138 mg/dL (ref <150)
Total CHOL/HDL Ratio: 3.8 Ratio
VLDL: 28 mg/dL (ref 0–40)

## 2013-10-31 NOTE — Telephone Encounter (Signed)
(  pt aware you are out to the office) Pt asked if you would review her recent blood work on 10/30/13 and give suggestions. Please advise

## 2013-11-01 NOTE — Telephone Encounter (Signed)
She told me she was going to see a primary MD.  That would be my suggestion to get a copy of her results from MyChart and follow up with them.

## 2013-11-04 NOTE — Telephone Encounter (Signed)
Pt informed with the below note. 

## 2014-02-17 ENCOUNTER — Encounter: Payer: Self-pay | Admitting: Gynecology

## 2014-05-12 ENCOUNTER — Other Ambulatory Visit: Payer: Self-pay | Admitting: Rheumatology

## 2014-05-12 ENCOUNTER — Ambulatory Visit
Admission: RE | Admit: 2014-05-12 | Discharge: 2014-05-12 | Disposition: A | Discharge status: Home / Self Care | Payer: BC Managed Care – PPO | Source: Ambulatory Visit | Attending: Rheumatology | Admitting: Rheumatology

## 2014-05-12 DIAGNOSIS — Z9225 Personal history of immunosupression therapy: Secondary | ICD-10-CM

## 2014-09-18 ENCOUNTER — Ambulatory Visit (INDEPENDENT_AMBULATORY_CARE_PROVIDER_SITE_OTHER): Payer: BC Managed Care – PPO | Admitting: Physician Assistant

## 2014-09-18 ENCOUNTER — Encounter: Payer: Self-pay | Admitting: Physician Assistant

## 2014-09-18 VITALS — BP 140/83 | HR 92 | Temp 98.8°F | Resp 16 | Ht 66.5 in | Wt 230.0 lb

## 2014-09-18 DIAGNOSIS — M25562 Pain in left knee: Secondary | ICD-10-CM | POA: Diagnosis not present

## 2014-09-18 DIAGNOSIS — Z6836 Body mass index (BMI) 36.0-36.9, adult: Secondary | ICD-10-CM | POA: Diagnosis not present

## 2014-09-18 DIAGNOSIS — E7800 Pure hypercholesterolemia, unspecified: Secondary | ICD-10-CM

## 2014-09-18 DIAGNOSIS — R739 Hyperglycemia, unspecified: Secondary | ICD-10-CM | POA: Diagnosis not present

## 2014-09-18 DIAGNOSIS — F411 Generalized anxiety disorder: Secondary | ICD-10-CM

## 2014-09-18 DIAGNOSIS — Z23 Encounter for immunization: Secondary | ICD-10-CM

## 2014-09-18 DIAGNOSIS — M069 Rheumatoid arthritis, unspecified: Secondary | ICD-10-CM

## 2014-09-18 DIAGNOSIS — M0579 Rheumatoid arthritis with rheumatoid factor of multiple sites without organ or systems involvement: Secondary | ICD-10-CM | POA: Insufficient documentation

## 2014-09-18 LAB — COMPREHENSIVE METABOLIC PANEL
ALK PHOS: 53 U/L (ref 39–117)
ALT: 26 U/L (ref 0–35)
AST: 26 U/L (ref 0–37)
Albumin: 4.4 g/dL (ref 3.5–5.2)
BILIRUBIN TOTAL: 0.6 mg/dL (ref 0.2–1.2)
BUN: 11 mg/dL (ref 6–23)
CALCIUM: 9.7 mg/dL (ref 8.4–10.5)
CHLORIDE: 105 meq/L (ref 96–112)
CO2: 23 meq/L (ref 19–32)
Creat: 0.71 mg/dL (ref 0.50–1.10)
GLUCOSE: 99 mg/dL (ref 70–99)
Potassium: 4.3 mEq/L (ref 3.5–5.3)
Sodium: 138 mEq/L (ref 135–145)
TOTAL PROTEIN: 7.9 g/dL (ref 6.0–8.3)

## 2014-09-18 LAB — LIPID PANEL
CHOLESTEROL: 221 mg/dL — AB (ref 0–200)
HDL: 50 mg/dL (ref >=46)
LDL Cholesterol: 151 mg/dL — ABNORMAL HIGH (ref 0–99)
TRIGLYCERIDES: 102 mg/dL (ref <150)
Total CHOL/HDL Ratio: 4.4 Ratio
VLDL: 20 mg/dL (ref 0–40)

## 2014-09-18 LAB — GLUCOSE, POCT (MANUAL RESULT ENTRY): POC GLUCOSE: 105 mg/dL — AB (ref 70–99)

## 2014-09-18 LAB — POCT GLYCOSYLATED HEMOGLOBIN (HGB A1C): Hemoglobin A1C: 5.3

## 2014-09-18 MED ORDER — ZOSTER VACCINE LIVE 19400 UNT/0.65ML ~~LOC~~ SOLR: 0.6500 mL | Freq: Once | SUBCUTANEOUS | Status: DC

## 2014-09-18 NOTE — Patient Instructions (Addendum)
I will contact you with your lab results as soon as they are available.   If you have not heard from me in 2 weeks, please contact me.  The fastest way to get your results is to register for My Chart (see the instructions on the last page of this printout).  You'll need a Flu vaccine when they are available in late summer.  Work on healthy eating habits and getting regular exercise. I recommend 150 minutes per week as your initial goal. Then increase from there! Aquatics are a great way to exercise without adding strain on your joints.

## 2014-09-18 NOTE — Progress Notes (Signed)
Subjective:   Patient ID: Debbie Williams, female     DOB: [birth year], 49 y.o.    MRN: [id number]  PCP: Kaydince Towles, PA-C  Chief Complaint  Patient presents with  . Follow-up    pt not sure why she is here, but needs follow up labs for elevated/abnormal labs  . Labs Only    HPI  Presents for vaccines in preparation for initiating immunosuppresive therapy for RA. She needs pneumococcal and zoster vaccines. In addition, she has a number of questions regarding labs performed by her rheumatologist and gynecologist. I last saw her prior to 04/2011.  She was apparently diagnosed with rheumatoid arthritis as an incidental finding during evaluation of a LEFT knee sprain. She initially saw Dr. Dierdre Forth, and then transferred to Dr. Corliss Skains with whom she feels more comfortable. She has considerable anxiety, which was exacerbated by the diagnosis of RA, and which has improved considerably with the help of her psychiatrist and psychologist.  She has decided to wait until fall to begin treatment, since it appears that her disease is early/mild at this point and she is relatively symptom free.  She brought with her labs performed in 04/2014 which we reviewed together. Positive ANA, 1:40 titer, not speckled pattern. Quantiferon Gold NEGATIVE. HIV NON-REACTIVE. CK elevated 343. CBC normal. UA with microscopy was normal, except for large bacteria (given normal dip and no symptoms, thought most likely to be contamination/non-clean catch). Glucose 165 (she isn't sure if she was fasting).  Labs from GYN 10/2013 are in [facility] and these are also reviewed. She had a glucose of 114, A1C 5.6%. LDL was 128, TG 138, HDL 55, TC 211.  Prior to Admission medications   Medication Sig Start Date End Date Taking? Authorizing Provider  cholecalciferol (VITAMIN D) 1000 UNITS tablet Take 1,000 Units by mouth daily.   Yes Historical Provider, MD  CINNAMON PO Take by mouth.   Yes Historical Provider, MD    levonorgestrel (MIRENA) 20 MCG/24HR IUD 1 each by Intrauterine route once. Inserted 09/20/2007    Yes Historical Provider, MD  Multiple Vitamin (MULTIVITAMIN) tablet Take 1 tablet by mouth daily.   Yes Historical Provider, MD  sertraline (ZOLOFT) 100 MG tablet Take 200 mg by mouth daily.    Yes Historical Provider, MD     No Known Allergies   Patient Active Problem List   Diagnosis Date Noted  . Rheumatoid arthritis 09/18/2014  . GAD (generalized anxiety disorder) 09/18/2014  . Left knee pain 09/18/2014  . Hyperglycemia 09/18/2014  . BMI 36.0-36.9,adult 09/18/2014     Family History  Problem Relation Age of Onset  . Hypertension Mother   . Arthritis Mother   . Diabetes Maternal Grandmother   . Heart disease Maternal Grandmother   . Arthritis Maternal Grandmother      History   Social History  . Marital Status: Legally Separated    Spouse Name: N/A  . Number of Children: 1  . Years of Education: N/A   Occupational History  . retired/disability     Warden/ranger   Social History Main Topics  . Smoking status: Never Smoker   . Smokeless tobacco: Never Used  . Alcohol Use: Yes     Comment: occ  . Drug Use: No  . Sexual Activity: Yes    Birth Control/ Protection: IUD     Comment: Mirena 09/2012   Other Topics Concern  . Not on file   Social History Narrative   Separated from her husband, though they share  a home for now.   Their daughter lives with them.        Review of Systems  Constitutional: Positive for unexpected weight change (since diiagnosis of RA, she has not been as active as previously and has gained back weight previously lost with lifestyle modification). Negative for fever, chills and fatigue.  HENT: Negative.   Eyes: Negative for visual disturbance.  Respiratory: Negative for cough, chest tightness, shortness of breath and wheezing.   Cardiovascular: Negative for chest pain, palpitations and leg swelling.  Gastrointestinal:  Negative for nausea and vomiting.  Genitourinary: Negative for dysuria, urgency, frequency and hematuria.  Musculoskeletal: Positive for joint swelling (LEFT knee, MCPs bilaterally) and arthralgias. Negative for myalgias, back pain and gait problem.  Skin: Negative for rash.  Neurological: Negative for dizziness, weakness and headaches.  Hematological: Negative for adenopathy. Does not bruise/bleed easily.  Psychiatric/Behavioral: Negative for suicidal ideas, sleep disturbance, self-injury and dysphoric mood. The patient is nervous/anxious.          Objective:  Physical Exam  Constitutional: She is oriented to person, place, and time. She appears well-developed and well-nourished. She is active and cooperative. No distress.  BP 140/83 mmHg  Pulse 92  Temp(Src) 98.8 F (37.1 C)  Resp 16  Ht 5' 6.5" (1.689 m)  Wt 230 lb (104.327 kg)  BMI 36.57 kg/m2  SpO2 96%   Eyes: Conjunctivae are normal.  Pulmonary/Chest: Effort normal.  Neurological: She is alert and oriented to person, place, and time.  Psychiatric: Her speech is normal and behavior is normal. Thought content normal. Her mood appears anxious. Her affect is not angry, not blunt, not labile and not inappropriate. She does not exhibit a depressed mood.         Results for orders placed or performed in visit on 09/18/14  POCT glucose (manual entry)  Result Value Ref Range   POC Glucose 105 (A) 70 - 99 mg/dl  POCT glycosylated hemoglobin (Hb A1C)  Result Value Ref Range   Hemoglobin A1C 5.3        Assessment & Plan:  1. Rheumatoid arthritis Proceed with recommendations per Dr. Corliss Skains. Pneumococcal 23-valent administered today. Rx for Zostavax provided, which she will get at her pharmacy and have administered there. I also encouraged her to get a flu vaccine at least two weeks prior to starting treatment. If the timing is right, she can to that here at her follow-up visit.  2. GAD (generalized anxiety  disorder) Stable. Continue current treatment and counseling.  3. Left knee pain She's been advised that this is NOT RA. As long as she doesn't have pain, she can walk for exercise, though she is encouraged to consider water exercise.  4. Hyperglycemia We discussed diabetes vs. Pre-diabetes. Either way, with a glucose today of 105 and A1C of 5.3%, there is not indication to start medication. She will increase exercise (see above) and work on healthy eating choices. We will plan to reassess in 3 months. - POCT glucose (manual entry) - POCT glycosylated hemoglobin (Hb A1C) - Comprehensive metabolic panel  5. BMI 36.0-36.9,adult See above.  6. Need for pneumococcal vaccine - Pneumococcal polysaccharide vaccine 23-valent greater than or equal to 2yo subcutaneous/IM  7. Need for shingles vaccine - zoster vaccine live, PF, (ZOSTAVAX) 75643 UNT/0.65ML injection; Inject 19,400 Units into the skin once.  Dispense: 1 each; Refill: 0  8. Elevated LDL cholesterol level Lifestyle changes as above. She does not want to take medications that are not mandatory, so we agreed  that unless there is really significant change from 10/2013, we'd hold off and see what she can do with healthy eating and exercise. - Lipid panel  9. Need for Tdap vaccination Overdue for Tdap. - Tdap vaccine greater than or equal to 7yo IM   Fernande Bras, PA-C Physician Assistant-Certified [facility]

## 2014-10-12 ENCOUNTER — Encounter: Payer: Self-pay | Admitting: Physician Assistant

## 2014-10-12 DIAGNOSIS — IMO0001 Reserved for inherently not codable concepts without codable children: Secondary | ICD-10-CM | POA: Insufficient documentation

## 2014-10-12 DIAGNOSIS — R030 Elevated blood-pressure reading, without diagnosis of hypertension: Secondary | ICD-10-CM

## 2014-10-12 DIAGNOSIS — J301 Allergic rhinitis due to pollen: Secondary | ICD-10-CM | POA: Insufficient documentation

## 2014-12-18 ENCOUNTER — Encounter: Payer: BC Managed Care – PPO | Admitting: Physician Assistant

## 2015-01-15 ENCOUNTER — Ambulatory Visit: Payer: BC Managed Care – PPO | Admitting: Physician Assistant

## 2015-02-10 ENCOUNTER — Ambulatory Visit (INDEPENDENT_AMBULATORY_CARE_PROVIDER_SITE_OTHER): Payer: BC Managed Care – PPO | Admitting: Physician Assistant

## 2015-02-10 ENCOUNTER — Encounter: Payer: Self-pay | Admitting: Physician Assistant

## 2015-02-10 VITALS — BP 138/79 | HR 89 | Temp 97.8°F | Resp 16 | Ht 66.25 in | Wt 224.4 lb

## 2015-02-10 DIAGNOSIS — Z6836 Body mass index (BMI) 36.0-36.9, adult: Secondary | ICD-10-CM | POA: Diagnosis not present

## 2015-02-10 DIAGNOSIS — R748 Abnormal levels of other serum enzymes: Secondary | ICD-10-CM | POA: Diagnosis not present

## 2015-02-10 DIAGNOSIS — Z1211 Encounter for screening for malignant neoplasm of colon: Secondary | ICD-10-CM

## 2015-02-10 DIAGNOSIS — R739 Hyperglycemia, unspecified: Secondary | ICD-10-CM

## 2015-02-10 DIAGNOSIS — E7800 Pure hypercholesterolemia, unspecified: Secondary | ICD-10-CM | POA: Diagnosis not present

## 2015-02-10 DIAGNOSIS — Z23 Encounter for immunization: Secondary | ICD-10-CM | POA: Diagnosis not present

## 2015-02-10 LAB — COMPREHENSIVE METABOLIC PANEL
ALK PHOS: 57 U/L (ref 33–130)
ALT: 19 U/L (ref 6–29)
AST: 22 U/L (ref 10–35)
Albumin: 4.4 g/dL (ref 3.6–5.1)
BUN: 11 mg/dL (ref 7–25)
CALCIUM: 9.7 mg/dL (ref 8.6–10.4)
CHLORIDE: 103 mmol/L (ref 98–110)
CO2: 23 mmol/L (ref 20–31)
Creat: 0.71 mg/dL (ref 0.50–1.05)
Glucose, Bld: 78 mg/dL (ref 65–99)
POTASSIUM: 3.7 mmol/L (ref 3.5–5.3)
Sodium: 138 mmol/L (ref 135–146)
TOTAL PROTEIN: 8.2 g/dL — AB (ref 6.1–8.1)
Total Bilirubin: 0.7 mg/dL (ref 0.2–1.2)

## 2015-02-10 LAB — LIPID PANEL
CHOLESTEROL: 220 mg/dL — AB (ref 125–200)
HDL: 63 mg/dL (ref >=46)
LDL CALC: 136 mg/dL — AB (ref <130)
TRIGLYCERIDES: 107 mg/dL (ref <150)
Total CHOL/HDL Ratio: 3.5 Ratio (ref <=5.0)
VLDL: 21 mg/dL (ref <30)

## 2015-02-10 LAB — CK: CK TOTAL: 309 U/L — AB (ref 7–177)

## 2015-02-10 LAB — POCT GLYCOSYLATED HEMOGLOBIN (HGB A1C): HEMOGLOBIN A1C: 5.1

## 2015-02-10 LAB — HEMOGLOBIN A1C: Hgb A1c MFr Bld: 5.1 % (ref 4.0–6.0)

## 2015-02-10 LAB — HM MAMMOGRAPHY: HM Mammogram: NEGATIVE

## 2015-02-10 NOTE — Progress Notes (Signed)
Patient ID: Debbie Williams, female    DOB: 1966/04/11, 49 y.o.   MRN: [id number]  PCP: Olene Floss  Subjective:   Chief Complaint  Patient presents with  . Follow-up    3 month cholesterol    HPI Presents for evaluation of cholesterol.  At her last visit, she misunderstood that while the A1C was excellent, I didn't have her lipid results yet. She just logged in to My Chart about 3 weeks ago and got my message about starting OTC fish oil.  She hasn't started the Methotrexate yet.  Getting the seasonal flu vaccine today, then waiting two weeks to start methotrexate. She's thinking she'll wait until after Thanksgiving to start it so she doesn't have adverse effects over the holiday.  At her last visit with rheumatology, the PA noted that the CK was elevated (300's) in January, and recommended that the level be rechecked. She declined then, but has since become worried about a muscle disease, and wants it rechecked.   Review of Systems  Constitutional: Negative for fever, chills, fatigue and unexpected weight change (intentional weight loss through healthier eating and increased exercise.).  Eyes: Negative for visual disturbance.  Respiratory: Negative for cough, choking, chest tightness, shortness of breath and wheezing.   Cardiovascular: Negative for chest pain, palpitations and leg swelling.  Gastrointestinal: Negative for nausea, vomiting and diarrhea.  Endocrine: Negative.   Genitourinary: Negative for dysuria, urgency, frequency and hematuria.  Musculoskeletal: Positive for joint swelling and arthralgias. Negative for myalgias, back pain and gait problem.  Allergic/Immunologic: Positive for environmental allergies.  Neurological: Negative for dizziness, weakness, light-headedness and headaches.  Hematological: Negative for adenopathy.  Psychiatric/Behavioral: Negative for suicidal ideas, sleep disturbance, self-injury and dysphoric mood. The patient is  nervous/anxious.        Patient Active Problem List   Diagnosis Date Noted  . Elevated blood pressure 10/12/2014  . Allergic rhinitis due to pollen 10/12/2014  . Rheumatoid arthritis (HCC) 09/18/2014  . GAD (generalized anxiety disorder) 09/18/2014  . Left knee pain 09/18/2014  . Hyperglycemia 09/18/2014  . BMI 36.0-36.9,adult 09/18/2014     Prior to Admission medications   Medication Sig Start Date End Date Taking? Authorizing Provider  CINNAMON PO Take by mouth.   Yes Historical Provider, MD  levonorgestrel (MIRENA) 20 MCG/24HR IUD 1 each by Intrauterine route once. Inserted 09/20/2007    Yes Historical Provider, MD  Multiple Vitamin (MULTIVITAMIN) tablet Take 1 tablet by mouth daily.   Yes Historical Provider, MD  Omega-3 Fatty Acids (FISH OIL) 1000 MG CAPS Take by mouth daily.   Yes Historical Provider, MD  sertraline (ZOLOFT) 100 MG tablet Take 200 mg by mouth daily.    Yes Historical Provider, MD  Turmeric 500 MG CAPS Take by mouth at bedtime.   Yes Historical Provider, MD  cholecalciferol (VITAMIN D) 1000 UNITS tablet Take 1,000 Units by mouth daily.    Historical Provider, MD  folic acid (FOLVITE) 1 MG tablet  01/20/15   Historical Provider, MD  methotrexate (RHEUMATREX) 2.5 MG tablet  01/20/15   Historical Provider, MD     No Known Allergies     Objective:  Physical Exam  Constitutional: She is oriented to person, place, and time. Vital signs are normal. She appears well-developed and well-nourished. She is active and cooperative. No distress.  BP 138/79 mmHg  Pulse 89  Temp(Src) 97.8 F (36.6 C) (Oral)  Resp 16  Ht 5' 6.25" (1.683 m)  Wt 224 lb 6.4 oz (101.787 kg)  BMI  35.94 kg/m2  HENT:  Head: Normocephalic and atraumatic.  Right Ear: Hearing normal.  Left Ear: Hearing normal.  Eyes: Conjunctivae are normal. No scleral icterus.  Neck: Normal range of motion. Neck supple. No thyromegaly present.  Cardiovascular: Normal rate, regular rhythm and normal heart  sounds.   Pulses:      Radial pulses are 2+ on the right side, and 2+ on the left side.  Pulmonary/Chest: Effort normal and breath sounds normal.  Lymphadenopathy:       Head (right side): No tonsillar, no preauricular, no posterior auricular and no occipital adenopathy present.       Head (left side): No tonsillar, no preauricular, no posterior auricular and no occipital adenopathy present.    She has no cervical adenopathy.       Right: No supraclavicular adenopathy present.       Left: No supraclavicular adenopathy present.  Neurological: She is alert and oriented to person, place, and time. No sensory deficit.  Skin: Skin is warm, dry and intact. No rash noted. No cyanosis or erythema. Nails show no clubbing.  Psychiatric: Her speech is normal and behavior is normal. Her mood appears anxious. Her affect is not angry, not blunt, not labile and not inappropriate. She does not exhibit a depressed mood.    Results for orders placed or performed in visit on 02/10/15  POCT glycosylated hemoglobin (Hb A1C)  Result Value Ref Range   Hemoglobin A1C 5.1           Assessment & Plan:   1. Hyperglycemia Excellent control.  - POCT glycosylated hemoglobin (Hb A1C)  2. Elevated LDL cholesterol level Await lab results. Continue OTC Fish Oil. - Lipid panel - Comprehensive metabolic panel  3. BMI 36.0-36.9,adult Continue her work on healthy lifestyle changes!  4. Elevated CK Await lab results. - CK  5. Screening for colon cancer - Ambulatory referral to Gastroenterology  6. Need for influenza vaccination - Flu Vaccine QUAD 36+ mos IM   Fernande Bras, PA-C Physician Assistant-Certified [facility]

## 2015-02-10 NOTE — Patient Instructions (Signed)
I will contact you with your lab results as soon as they are available.   If you have not heard from me in 2 weeks, please contact me.  The fastest way to get your results is to register for My Chart (see the instructions on the last page of this printout).   

## 2015-02-11 ENCOUNTER — Encounter: Payer: Self-pay | Admitting: Gynecology

## 2015-02-11 ENCOUNTER — Ambulatory Visit (INDEPENDENT_AMBULATORY_CARE_PROVIDER_SITE_OTHER): Payer: BC Managed Care – PPO | Admitting: Gynecology

## 2015-02-11 VITALS — BP 124/74 | Ht 67.0 in | Wt 226.0 lb

## 2015-02-11 DIAGNOSIS — Z01419 Encounter for gynecological examination (general) (routine) without abnormal findings: Secondary | ICD-10-CM

## 2015-02-11 DIAGNOSIS — Z30431 Encounter for routine checking of intrauterine contraceptive device: Secondary | ICD-10-CM | POA: Diagnosis not present

## 2015-02-11 NOTE — Progress Notes (Signed)
Debbie Williams 1965-05-27 [id number]        49 y.o.  [id number] for annual exam.  Doing well without complaints  Past medical history,surgical history, problem list, medications, allergies, family history and social history were all reviewed and documented as reviewed in the EPIC chart.  ROS:  Performed with pertinent positives and negatives included in the history, assessment and plan.   Additional significant findings :  none   Exam: [name] Vitals:   02/11/15 1126  BP: 124/74  Height: 5\' 7"  (1.702 m)  Weight: 226 lb (102.513 kg)   General appearance:  Normal affect, orientation and appearance. Skin: Grossly normal HEENT: Without gross lesions.  No cervical or supraclavicular adenopathy. Thyroid normal.  Lungs:  Clear without wheezing, rales or rhonchi Cardiac: RR, without RMG Abdominal:  Soft, nontender, without masses, guarding, rebound, organomegaly or hernia Breasts:  Examined lying and sitting without masses, retractions, discharge or axillary adenopathy. Pelvic:  Ext/BUS/vagina normal  Cervix normal. IUD string visualized  Uterus anteverted, normal size, shape and contour, midline and mobile nontender   Adnexa  Without masses or tenderness    Anus and perineum  Normal   Rectovaginal  Normal sphincter tone without palpated masses or tenderness.    Assessment/Plan:  49 y.o. 43 female for annual exam with scant monthly menses, Mirena IUD.   1. Mirena IUD 2014. Doing well with scant monthly menses. No menopausal symptoms. Continue to monitor. 2. Mammography yesterday. Continue with annual mammography when due. SBE monthly reviewed. 3. Pap smear/HPV negative 2014. No Pap smear done today.  No history of abnormal Pap smears previously. 4. Colonoscopy. In the process of arranging now. 5. Health maintenance. No lab work done as she recently had this done at her primary physician's practitioners.  Follow up one year, sooner as  needed.   2015 MD, 11:42 AM 02/11/2015

## 2015-02-11 NOTE — Patient Instructions (Signed)

## 2015-02-13 ENCOUNTER — Encounter: Payer: Self-pay | Admitting: Family Medicine

## 2015-02-25 ENCOUNTER — Encounter: Payer: Self-pay | Admitting: Physician Assistant

## 2015-03-24 NOTE — Progress Notes (Signed)
This encounter was created in error - please disregard.

## 2015-06-11 ENCOUNTER — Encounter: Payer: Self-pay | Admitting: Physician Assistant

## 2016-02-17 ENCOUNTER — Telehealth: Payer: Self-pay | Admitting: Radiology

## 2016-02-17 MED ORDER — METHOTREXATE SODIUM CHEMO INJECTION 50 MG/2ML: 20.0000 mg | INTRAMUSCULAR | 0 refills | Status: DC

## 2016-02-17 NOTE — Telephone Encounter (Signed)
Thank you, Amy.   I see you took a lot of time w/ patient.

## 2016-02-17 NOTE — Telephone Encounter (Signed)
I have discussed Methotrexate with patient again. Clarified with patient her Methotrexate should be with preservative so she can get 2 doses out of each vial. Patient is not sure if her vials are with preservative or preservative free. I have advised her to discuss with pharmacy. I have had this same discussion with her in the office and by phone in August. Mr Leane Call has as well.   She now states she has drawn one dose out of each vial and has half full vials. I have advised her to discuss with pharmacy and see if she can use them again, explained to her if they have preservative she may use vials again, but she will need to ask pharmacy.   All of her Rx's have been sent in with preservative, but she indicates someone at the pharmacy told her to draw out a dose, then throw away the remaining MTX.  I have told her I will resend again to pharmacy, previously I have discussed with her pharmacy and advised them to dispense the MTX WITH preservative so she may get 2 doses out of each vial   To you FYI   Also advised her when labs are due (nov)

## 2016-03-16 ENCOUNTER — Telehealth: Payer: Self-pay | Admitting: Rheumatology

## 2016-03-16 NOTE — Telephone Encounter (Signed)
Patient is coming in tomorrow. Here

## 2016-03-16 NOTE — Telephone Encounter (Signed)
Please fax pt's lab order to [facility] @ [phone].

## 2016-03-17 ENCOUNTER — Other Ambulatory Visit: Payer: Self-pay | Admitting: Radiology

## 2016-03-17 DIAGNOSIS — Z79899 Other long term (current) drug therapy: Secondary | ICD-10-CM

## 2016-03-17 LAB — COMPLETE METABOLIC PANEL WITH GFR
ALT: 22 U/L (ref 6–29)
AST: 22 U/L (ref 10–35)
Albumin: 4.2 g/dL (ref 3.6–5.1)
Alkaline Phosphatase: 50 U/L (ref 33–130)
BUN: 10 mg/dL (ref 7–25)
CHLORIDE: 105 mmol/L (ref 98–110)
CO2: 23 mmol/L (ref 20–31)
CREATININE: 0.87 mg/dL (ref 0.50–1.05)
Calcium: 9.4 mg/dL (ref 8.6–10.4)
GFR, Est African American: 89 mL/min (ref >=60)
GFR, Est Non African American: 77 mL/min (ref >=60)
Glucose, Bld: 91 mg/dL (ref 65–99)
POTASSIUM: 4.2 mmol/L (ref 3.5–5.3)
SODIUM: 139 mmol/L (ref 135–146)
Total Bilirubin: 0.6 mg/dL (ref 0.2–1.2)
Total Protein: 7.4 g/dL (ref 6.1–8.1)

## 2016-03-17 LAB — CBC WITH DIFFERENTIAL/PLATELET
Basophils Absolute: 0 cells/uL (ref 0–200)
Basophils Relative: 0 %
EOS PCT: 5 %
Eosinophils Absolute: 330 cells/uL (ref 15–500)
HCT: 36.5 % (ref 35.0–45.0)
HEMOGLOBIN: 12.1 g/dL (ref 11.7–15.5)
LYMPHS ABS: 3168 {cells}/uL (ref 850–3900)
Lymphocytes Relative: 48 %
MCH: 29.1 pg (ref 27.0–33.0)
MCHC: 33.2 g/dL (ref 32.0–36.0)
MCV: 87.7 fL (ref 80.0–100.0)
MPV: 10.4 fL (ref 7.5–12.5)
Monocytes Absolute: 396 cells/uL (ref 200–950)
Monocytes Relative: 6 %
NEUTROS ABS: 2706 {cells}/uL (ref 1500–7800)
Neutrophils Relative %: 41 %
PLATELETS: 428 10*3/uL — AB (ref 140–400)
RBC: 4.16 MIL/uL (ref 3.80–5.10)
RDW: 14 % (ref 11.0–15.0)
WBC: 6.6 10*3/uL (ref 3.8–10.8)

## 2016-03-18 ENCOUNTER — Other Ambulatory Visit: Payer: Self-pay | Admitting: Rheumatology

## 2016-03-18 NOTE — Telephone Encounter (Signed)
Patient advised she was sent in a refill on February 17, 2016 for a 3 month supply and it was too early for a refill. Patient states she will look at her vials and make sure she received MTX with preservative and she should have enough to last her until close to her next refill.

## 2016-03-18 NOTE — Progress Notes (Signed)
Labs stable

## 2016-03-18 NOTE — Telephone Encounter (Signed)
Patient came in for labs yesterday and needs refill of [facility] [address].

## 2016-03-21 DIAGNOSIS — F419 Anxiety disorder, unspecified: Secondary | ICD-10-CM | POA: Insufficient documentation

## 2016-03-21 DIAGNOSIS — Z79899 Other long term (current) drug therapy: Secondary | ICD-10-CM | POA: Insufficient documentation

## 2016-03-21 NOTE — Progress Notes (Signed)
Office Visit Note  Patient: Debbie Williams             Date of Birth: 1966-04-10           MRN: [id number]             PCP: Harrison Mons, PA-C Referring: Harrison Mons, PA-C Visit Date: 03/22/2016 Occupation: Disability    Subjective:  Joint stiffness.   History of Present Illness: Debbie Williams is a 50 y.o. female with history of sero positive erosive disease. She states she did really well after her left knee joint cortisone injection. Currently she is not having much discomfort in any of her joints. She denies morning stiffness. She ran out of her methotrexate injections last week. She states is difficult for her to draw from the while because there is not sufficient quantity in the vial of methotrexate.   Activities of Daily Living:  Patient reports morning stiffness for 0 minute.   Patient Denies nocturnal pain.  Difficulty dressing/grooming: Denies Difficulty climbing stairs: Denies Difficulty getting out of chair: Denies Difficulty using hands for taps, buttons, cutlery, and/or writing: Denies   Review of Systems  Constitutional: Positive for fatigue. Negative for night sweats, weight gain, weight loss and weakness.  HENT: Negative for mouth sores, trouble swallowing, trouble swallowing, mouth dryness and nose dryness.   Eyes: Negative for pain, redness, visual disturbance and dryness.  Respiratory: Negative for cough, shortness of breath and difficulty breathing.   Cardiovascular: Negative for chest pain, palpitations, hypertension, irregular heartbeat and swelling in legs/feet.  Gastrointestinal: Negative for blood in stool, constipation and diarrhea.  Endocrine: Negative for increased urination.  Genitourinary: Negative for vaginal dryness.  Musculoskeletal: Negative for arthralgias, joint pain, joint swelling, myalgias, muscle weakness, muscle tenderness and myalgias.  Skin: Negative for color change, rash, hair loss, skin tightness, ulcers  and sensitivity to sunlight.  Allergic/Immunologic: Negative for susceptible to infections.  Neurological: Negative for dizziness, memory loss and night sweats.  Hematological: Negative for swollen glands.  Psychiatric/Behavioral: Positive for depressed mood. Negative for sleep disturbance. The patient is nervous/anxious.     PMFS History:  Patient Active Problem List   Diagnosis Date Noted  . High risk medication use 03/21/2016  . Anxiety 03/21/2016  . Elevated blood pressure 10/12/2014  . Allergic rhinitis due to pollen 10/12/2014  . 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 GAD (generalized anxiety disorder) 09/18/2014  . Left knee pain 09/18/2014  . Hyperglycemia 09/18/2014  . BMI 36.0-36.9,adult 09/18/2014    Past Medical History:  Diagnosis Date  . Anxiety   . Arthritis   . Depression   . Hx gestational diabetes     Family History  Problem Relation Age of Onset  . Hypertension Mother   . Arthritis Mother   . Diabetes Maternal Grandmother   . Heart disease Maternal Grandmother   . Arthritis Maternal Grandmother    Past Surgical History:  Procedure Laterality Date  . INTRAUTERINE DEVICE INSERTION  09/2012   Mirena   Social History   Social History Narrative   Separated from her husband, though they share a home for now.   Their daughter lives with them.     Objective: Vital Signs: BP (!) 158/83 (BP Location: Left Arm, Patient Position: Sitting, Cuff Size: Large)   Pulse 88   Resp 14   Ht '5\' 6"'  (1.676 m)   Wt 241 lb (109.3 kg)   BMI 38.90 kg/m  Physical Exam  Constitutional: She is oriented to person, place, and time. She appears well-developed and well-nourished.  HENT:  Head: Normocephalic and atraumatic.  Eyes: Conjunctivae and EOM are normal.  Neck: Normal range of motion.  Cardiovascular: Normal rate, regular rhythm, normal heart sounds and intact distal pulses.     Pulmonary/Chest: Effort normal and breath sounds normal.  Abdominal: Soft. Bowel sounds are normal.  Lymphadenopathy:    She has no cervical adenopathy.  Neurological: She is alert and oriented to person, place, and time.  Skin: Skin is warm and dry. Capillary refill takes less than 2 seconds.  Psychiatric: She has a normal mood and affect. Her behavior is normal.  Nursing note and vitals reviewed.    Musculoskeletal Exam: C-spine, thoracic, lumbar spine good range of motion. Shoulder joints, elbow joints, wrist joints good range of motion. She has thickening of bilateral second and third MCP joints with no synovitis. PIPs and DIPs with good range of motion with no synovitis. Hip joints knee joints, ankle joints, MTPs PIPs with good range of motion with no synovitis.  CDAI Exam: CDAI Homunculus Exam:   Joint Counts:  CDAI Tender Joint count: 0 CDAI Swollen Joint count: 0  Global Assessments:  Patient Global Assessment: 0 Provider Global Assessment: 0    Investigation: Findings:  CBC CMP 03/17/16 normal  Jan 2016  CK was 316, which was mildly elevated, TSH was normal, UA showed protein and bacteria, her ANA was positive with titer negative, HIV, immunoglobin, SPEP and TB-Gold were all within normal limits.      05/14/2015 X-rays of bilateral hands, 2 views, show all PIP narrowing.  Juxta-articular osteopenia.  Left 1st MCP erosive change.  Left intercarpal joint and radiocarpal joint severe narrowing.  No interval change from January 2016.  Bilateral feet x-rays showed erosive changes in bilateral 1st MTP joint, right 3rd, 4th and 5th MTP joints, left 3rd, 4th and 5th MTP joints, worse on the right than the left.  There was PIP and DIP narrowing.  No interval change from January 2016.  12/09/2015 CBC normal, CMP normal     Imaging: No results found.  Speciality Comments: No specialty comments available.    Procedures:  No procedures performed Allergies: Patient has no  known allergies.   Assessment / Plan:     Visit Diagnoses: Rheumatoid arthritis  - Positive rheumatoid factor, positive CCP, elevated ESR, erosive disease: Her arthritis is quite well controlled now with minimal stiffness she has no synovitis on exam. She does have some synovial thickening in her hands. She requested a parking placard. I feel at this time because arthritis is well controlled she should be more active and she be doing  more exercise. As she ran out of her methotrexate we gave her 1 sample of otrexup 20 mg 1. She will go to the pharmacy for further refills of methotrexate.  High risk medication use - methotrexate 0.8 mL, folic acid 2 mg daily: Her most recent labs from November 30 showed CBC and comprehensive metabolic panel were normal. Her next labs will be to and February and every 3 months.  Chronic pain of left knee: She is doing better after the cortisone injection.  She is some relocating to [place] area. She will try to find a rheumatologist close to her home. I will continue to provide care to her until she finds a new rheumatologist.  GAD (generalized anxiety disorder)  Depression, unspecified depression type - PTSD followed up by Dr. Toy Care  Orders: No orders of the defined types were placed in this encounter.  No orders of the defined types were placed in this encounter.   Face-to-face time spent with patient was 30 minutes. 50% of time was spent in counseling and coordination of care.  Follow-Up Instructions: Return in about 5 months (around 08/20/2016) for Rheumatoid arthritis.   Bo Merino, MD

## 2016-03-22 ENCOUNTER — Ambulatory Visit: Payer: BC Managed Care – PPO | Admitting: Rheumatology

## 2016-03-22 ENCOUNTER — Encounter: Payer: Self-pay | Admitting: Rheumatology

## 2016-03-22 ENCOUNTER — Ambulatory Visit (INDEPENDENT_AMBULATORY_CARE_PROVIDER_SITE_OTHER): Payer: Medicare Other | Admitting: Rheumatology

## 2016-03-22 VITALS — BP 158/83 | HR 88 | Resp 14 | Ht 66.0 in | Wt 241.0 lb

## 2016-03-22 DIAGNOSIS — M25562 Pain in left knee: Secondary | ICD-10-CM

## 2016-03-22 DIAGNOSIS — F32A Depression, unspecified: Secondary | ICD-10-CM

## 2016-03-22 DIAGNOSIS — M0579 Rheumatoid arthritis with rheumatoid factor of multiple sites without organ or systems involvement: Secondary | ICD-10-CM | POA: Diagnosis not present

## 2016-03-22 DIAGNOSIS — F411 Generalized anxiety disorder: Secondary | ICD-10-CM

## 2016-03-22 DIAGNOSIS — F329 Major depressive disorder, single episode, unspecified: Secondary | ICD-10-CM | POA: Diagnosis not present

## 2016-03-22 DIAGNOSIS — Z79899 Other long term (current) drug therapy: Secondary | ICD-10-CM

## 2016-03-22 DIAGNOSIS — G8929 Other chronic pain: Secondary | ICD-10-CM

## 2016-04-04 ENCOUNTER — Other Ambulatory Visit: Payer: Self-pay | Admitting: Rheumatology

## 2016-04-04 NOTE — Telephone Encounter (Signed)
Patient needs a refill [facility] [address]

## 2016-04-05 MED ORDER — "TUBERCULIN-ALLERGY SYRINGES 27G X 1/2"" 1 ML KIT": PACK | 0 refills | Status: DC

## 2016-04-05 NOTE — Telephone Encounter (Signed)
ok 

## 2016-04-05 NOTE — Telephone Encounter (Signed)
Last Visit: 03/22/16 Next Visit: 08/20/16 Labs: 03/17/16 Stable  Okay to refill syringes?

## 2016-04-21 ENCOUNTER — Other Ambulatory Visit: Payer: Self-pay | Admitting: Rheumatology

## 2016-04-21 MED ORDER — METHOTREXATE SODIUM CHEMO INJECTION 50 MG/2ML: 20.0000 mg | INTRAMUSCULAR | 0 refills | Status: DC

## 2016-04-21 NOTE — Telephone Encounter (Signed)
Patient is requesting refill of [facility]TX to [facility] [address].

## 2016-04-21 NOTE — Telephone Encounter (Signed)
ok 

## 2016-04-21 NOTE — Telephone Encounter (Signed)
Last visit: 03/22/16 Next Visit: 08/23/16 Labs: 03/17/16 Stable  Okay to refill MTX?

## 2016-06-02 ENCOUNTER — Encounter: Payer: Medicare Other | Admitting: Gynecology

## 2016-06-24 ENCOUNTER — Ambulatory Visit (INDEPENDENT_AMBULATORY_CARE_PROVIDER_SITE_OTHER): Payer: Medicare Other | Admitting: Gynecology

## 2016-06-24 ENCOUNTER — Encounter: Payer: Self-pay | Admitting: Gynecology

## 2016-06-24 VITALS — BP 140/90 | Ht 66.0 in | Wt 236.0 lb

## 2016-06-24 DIAGNOSIS — Z01419 Encounter for gynecological examination (general) (routine) without abnormal findings: Secondary | ICD-10-CM | POA: Diagnosis not present

## 2016-06-24 DIAGNOSIS — Z30431 Encounter for routine checking of intrauterine contraceptive device: Secondary | ICD-10-CM

## 2016-06-24 NOTE — Progress Notes (Signed)
    Debbie Williams Feb 23, 1966 [id number]        51 y.o.  [id number] for breast and pelvic exam  Past medical history,surgical history, problem list, medications, allergies, family history and social history were all reviewed and documented as reviewed in the EPIC chart.  ROS:  Performed with pertinent positives and negatives included in the history, assessment and plan.   Additional significant findings :  None   Exam: Kennon Portela assistant Vitals:   06/24/16 1513  BP: 140/90  Weight: 236 lb (107 kg)  Height: 5\' 6"  (1.676 m)   Body mass index is 38.09 kg/m.  General appearance:  Normal affect, orientation and appearance. Skin: Grossly normal HEENT: Without gross lesions.  No cervical or supraclavicular adenopathy. Thyroid normal.  Lungs:  Clear without wheezing, rales or rhonchi Cardiac: RR, without RMG Abdominal:  Soft, nontender, without masses, guarding, rebound, organomegaly or hernia Breasts:  Examined lying and sitting without masses, retractions, discharge or axillary adenopathy. Pelvic:  Ext, BUS, Vagina: Normal  Cervix: Normal. IUD string visualized  Uterus: Anteverted, normal size, shape and contour, midline and mobile nontender   Adnexa: Without masses or tenderness    Anus and perineum: Normal   Rectovaginal: Normal sphincter tone without palpated masses or tenderness.    Assessment/Plan:  51 y.o. 43 female for annual exam scant menses, Mirena IUD.   1. Mirena IUD 09/2012. Doing well with scant menses. No menopausal symptoms. 2. Mammography overdue patient knows to schedule. SBE monthly reviewed. 3. Pap smear/HPV 2014 negative. No Pap smear done today. No history of abnormal Pap smears. Plan repeat Pap smear at 5 year interval per current screening guidelines. 4. Colonoscopy not yet. Recommended patient schedule and she knows to do so and agrees to arrange. 5. Health maintenance. Mild elevated blood pressure 140/90. Patient in the process of  scheduling an appointment with her primary provider now and will follow up with her in reference to this. She will have routine lab work done there. Follow up in one year, sooner as needed.   10/2012 MD, 3:28 PM 06/24/2016

## 2016-06-24 NOTE — Patient Instructions (Addendum)
Schedule your mammogram.  Schedule your colonoscopy.  Follow up with Theora Gianotti for you routine checkup

## 2016-07-07 IMAGING — CR DG CHEST 2V
2 series · 2 of 2 positions shown · non-contrast
Comparison: None.

CLINICAL DATA: Rheumatoid arthritis. Patient to start
immunosuppressive therapy. Cough.

EXAM:
CHEST  2 VIEW

[w chest pa]
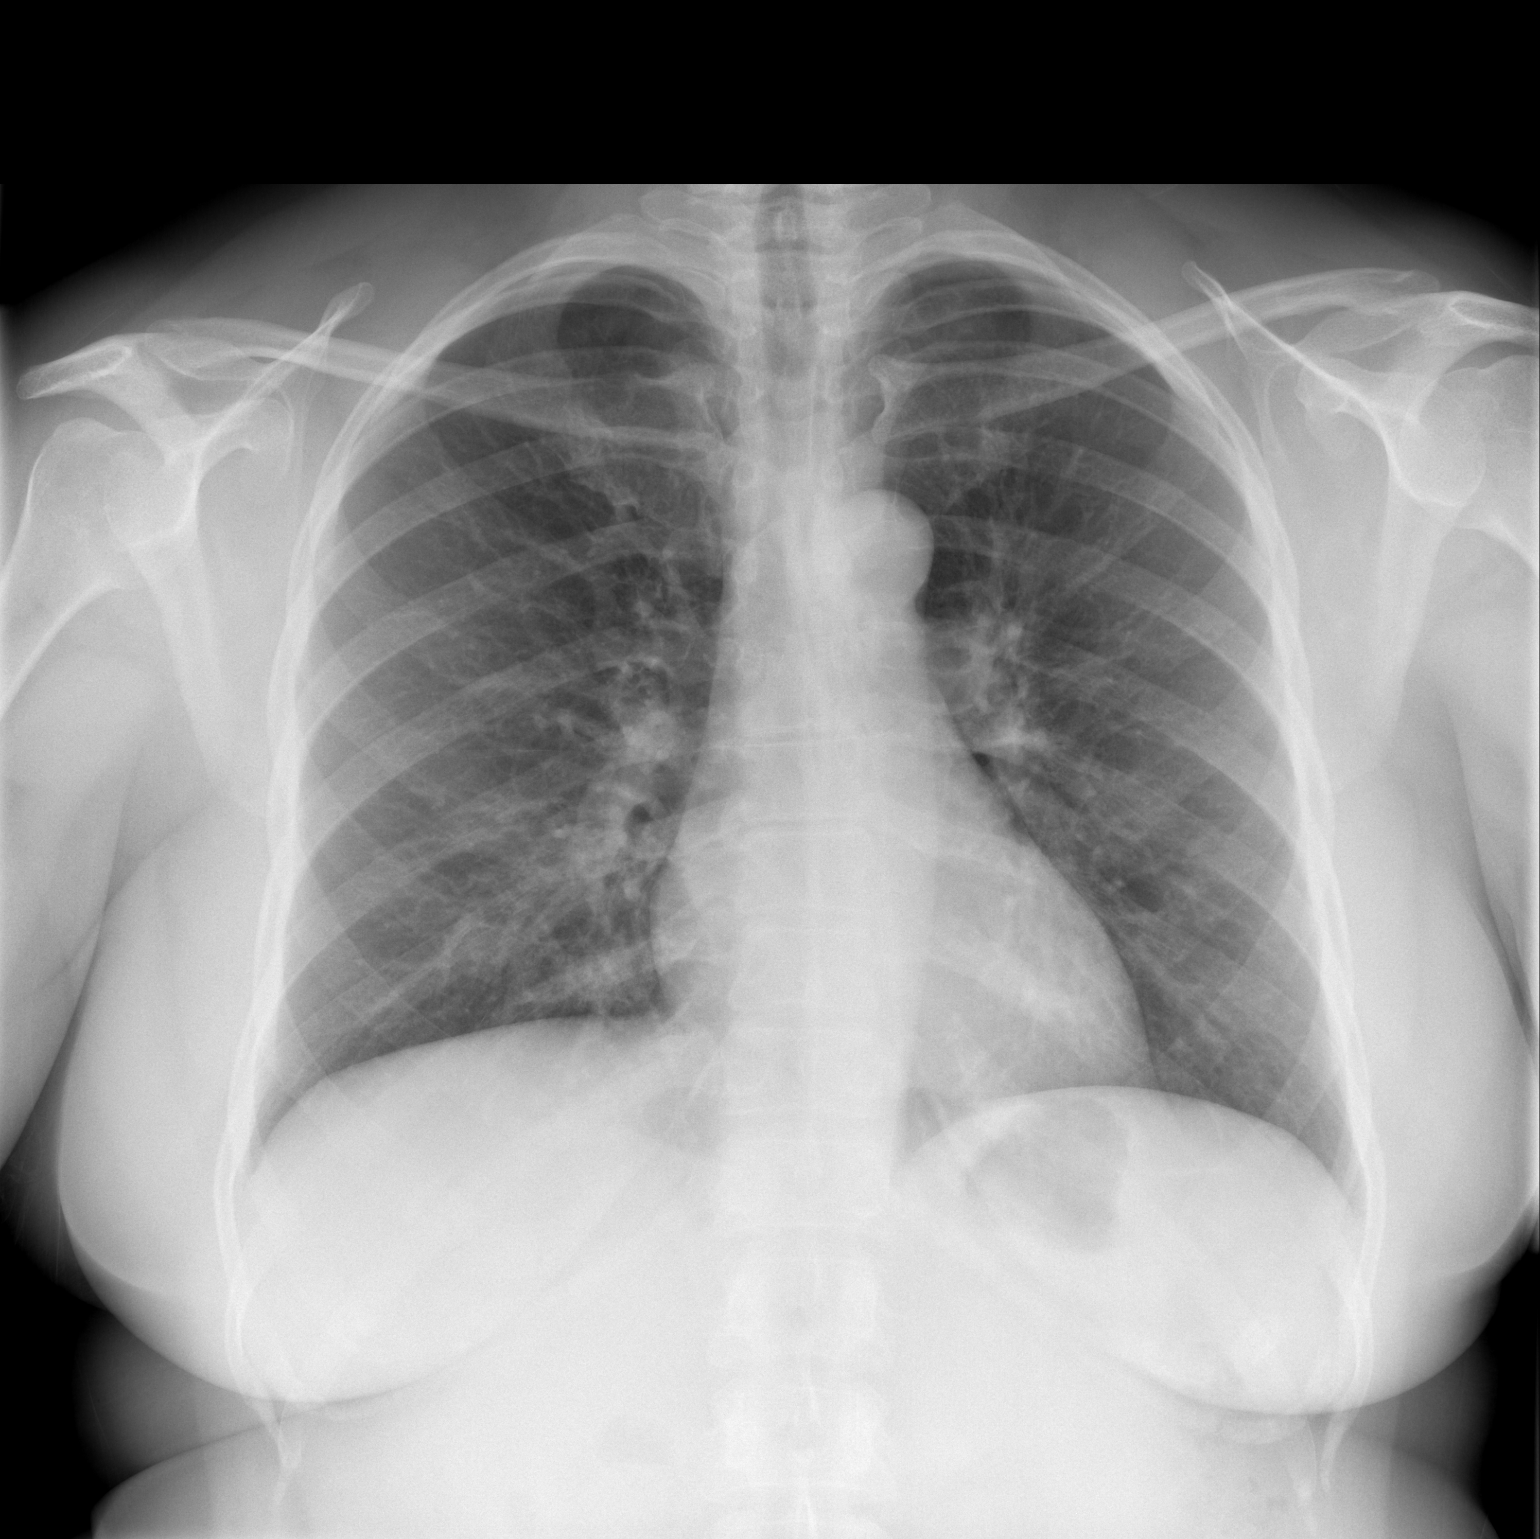

[w chest lat]
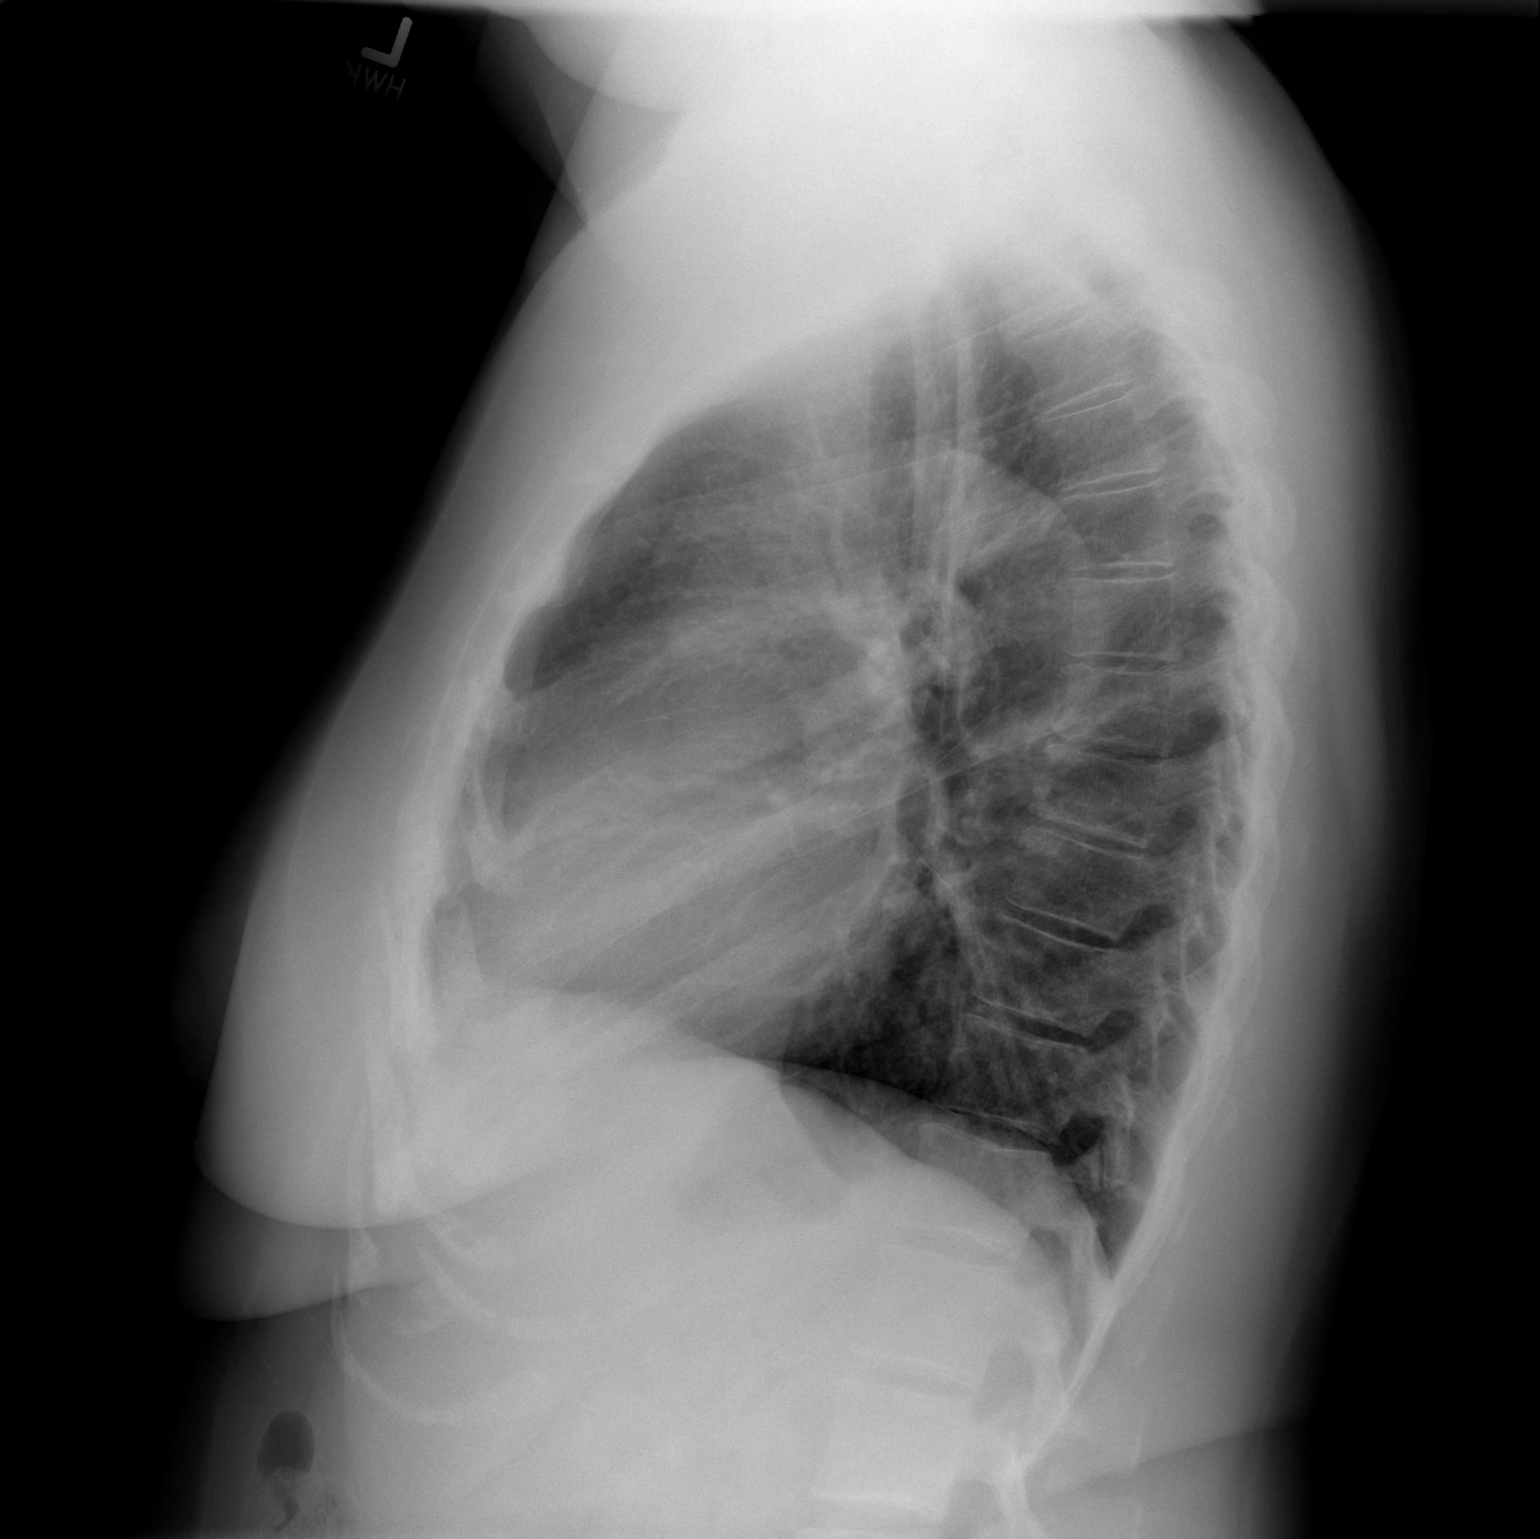

[2 of 2 positions shown; findings below may reference images not displayed]

FINDINGS: The heart size and mediastinal contours are within normal limits.
Both lungs are clear. The visualized skeletal structures are
unremarkable.
IMPRESSION: Normal exam.

## 2016-08-16 ENCOUNTER — Telehealth: Payer: Self-pay | Admitting: Rheumatology

## 2016-08-16 NOTE — Telephone Encounter (Signed)
Patient needs a rf on MTX. Has enough for this week. [facility] uses [facility] [address]

## 2016-08-17 MED ORDER — METHOTREXATE SODIUM CHEMO INJECTION 50 MG/2ML: 20.0000 mg | INTRAMUSCULAR | 0 refills | Status: DC

## 2016-08-17 NOTE — Telephone Encounter (Signed)
30 d only

## 2016-08-17 NOTE — Telephone Encounter (Signed)
Patient advised she is in need of lab prior to refill.  Last Visit: 03/22/16 Next Visit: 08/23/16 Labs: 03/17/16 Stable Patient will be in this week to update labs this week.  Okay to refill MTX?

## 2016-08-18 ENCOUNTER — Other Ambulatory Visit: Payer: Self-pay

## 2016-08-18 DIAGNOSIS — Z79899 Other long term (current) drug therapy: Secondary | ICD-10-CM

## 2016-08-18 LAB — COMPLETE METABOLIC PANEL WITH GFR
ALT: 20 U/L (ref 6–29)
AST: 20 U/L (ref 10–35)
Albumin: 4.3 g/dL (ref 3.6–5.1)
Alkaline Phosphatase: 56 U/L (ref 33–130)
BUN: 12 mg/dL (ref 7–25)
CALCIUM: 9.9 mg/dL (ref 8.6–10.4)
CHLORIDE: 105 mmol/L (ref 98–110)
CO2: 22 mmol/L (ref 20–31)
Creat: 0.82 mg/dL (ref 0.50–1.05)
GFR, EST NON AFRICAN AMERICAN: 83 mL/min (ref >=60)
Glucose, Bld: 109 mg/dL — ABNORMAL HIGH (ref 65–99)
POTASSIUM: 4.2 mmol/L (ref 3.5–5.3)
Sodium: 137 mmol/L (ref 135–146)
Total Bilirubin: 0.6 mg/dL (ref 0.2–1.2)
Total Protein: 7.9 g/dL (ref 6.1–8.1)

## 2016-08-18 LAB — CBC WITH DIFFERENTIAL/PLATELET
Basophils Absolute: 0 cells/uL (ref 0–200)
Basophils Relative: 0 %
EOS ABS: 268 {cells}/uL (ref 15–500)
Eosinophils Relative: 4 %
HCT: 37.7 % (ref 35.0–45.0)
Hemoglobin: 12.2 g/dL (ref 11.7–15.5)
LYMPHS PCT: 48 %
Lymphs Abs: 3216 cells/uL (ref 850–3900)
MCH: 27.8 pg (ref 27.0–33.0)
MCHC: 32.4 g/dL (ref 32.0–36.0)
MCV: 85.9 fL (ref 80.0–100.0)
MONOS PCT: 5 %
MPV: 10.8 fL (ref 7.5–12.5)
Monocytes Absolute: 335 cells/uL (ref 200–950)
NEUTROS PCT: 43 %
Neutro Abs: 2881 cells/uL (ref 1500–7800)
PLATELETS: 459 10*3/uL — AB (ref 140–400)
RBC: 4.39 MIL/uL (ref 3.80–5.10)
RDW: 13.9 % (ref 11.0–15.0)
WBC: 6.7 10*3/uL (ref 3.8–10.8)

## 2016-08-18 NOTE — Progress Notes (Signed)
Office Visit Note  Patient: Debbie Williams             Date of Birth: Apr 21, 1965           MRN: [id number]             PCP: Harrison Mons, PA-C Referring: Harrison Mons, PA-C Visit Date: 08/23/2016 Occupation: _0 @    Subjective:  Follow-up on rheumatoid arthritis, fatigue  History of Present Illness: Debbie Williams is a 51 y.o. female with a history of rheumatoid arthritis. According to patient her rheumatoid arthritis seems to be quite well-controlled on methotrexate. She complains of some fatigue after the methotrexate dose. She denies any joint swelling. She denies any knee joint discomfort today.    Activities of Daily Living:  Patient reports morning stiffness for 0 minutes.   Patient Denies nocturnal pain.  Difficulty dressing/grooming: Denies Difficulty climbing stairs: Denies Difficulty getting out of chair: Denies Difficulty using hands for taps, buttons, cutlery, and/or writing: Denies   Review of Systems  Constitutional: Negative for fatigue, night sweats, weight gain, weight loss and weakness.  HENT: Negative for mouth sores, mouth dryness and nose dryness.   Eyes: Negative for pain, redness, itching and dryness.  Respiratory: Negative for cough, shortness of breath and difficulty breathing.   Cardiovascular: Negative for chest pain, palpitations, hypertension, irregular heartbeat and swelling in legs/feet.  Gastrointestinal: Negative for blood in stool, constipation and diarrhea.  Genitourinary: Negative for painful urination.  Musculoskeletal: Positive for arthralgias and joint pain. Negative for joint swelling, myalgias, muscle weakness, morning stiffness, muscle tenderness and myalgias.  Skin: Negative for color change, pallor, rash, nodules/bumps, redness, skin tightness and ulcers.  Allergic/Immunologic: Negative for susceptible to infections.  Neurological: Negative for dizziness, headaches and night sweats.  Hematological:  Negative for swollen glands.  Psychiatric/Behavioral: Negative for depressed mood and sleep disturbance. The patient is not nervous/anxious.     PMFS History:  Patient Active Problem List   Diagnosis Date Noted  . High risk medication use 03/21/2016  . Anxiety 03/21/2016  . Elevated blood pressure 10/12/2014  . Allergic rhinitis due to pollen 10/12/2014  . Rheumatoid arthritis with rheumatoid factor of multiple sites without organ or systems involvement ([facility]) 09/18/2014  . GAD (generalized anxiety disorder) 09/18/2014  . Left knee pain 09/18/2014  . Hyperglycemia 09/18/2014  . BMI 36.0-36.9,adult 09/18/2014    Past Medical History:  Diagnosis Date  . Anxiety   . Arthritis    Rheumatoid  . Depression     Family History  Problem Relation Age of Onset  . Hypertension Mother   . Arthritis Mother   . Diabetes Maternal Grandmother   . Heart disease Maternal Grandmother   . Arthritis Maternal Grandmother    Past Surgical History:  Procedure Laterality Date  . INTRAUTERINE DEVICE INSERTION  09/2012   Mirena   Social History   Social History Narrative   Separated from her husband, though they share a home for now.   Their daughter lives with them.     Objective: Vital Signs: BP (!) 158/84   Pulse 96   Resp 14   Ht _1  (1.676 m)   Wt 239 lb (108.4 kg)   BMI 38.58 kg/m    Physical Exam  Constitutional: She is oriented to person, place, and time. She appears well-developed and well-nourished.  HENT:  Head: Normocephalic and atraumatic.  Eyes: Conjunctivae and EOM are normal.  Neck: Normal range of motion.  Cardiovascular: Normal rate, regular rhythm, normal heart sounds and  intact distal pulses.   Pulmonary/Chest: Effort normal and breath sounds normal.  Abdominal: Soft. Bowel sounds are normal.  Lymphadenopathy:    She has no cervical adenopathy.  Neurological: She is alert and oriented to person, place, and time.  Skin: Skin is warm and dry. Capillary refill  takes less than 2 seconds.  Psychiatric: She has a normal mood and affect. Her behavior is normal.  Nursing note and vitals reviewed.    Musculoskeletal Exam: C-spine and thoracic lumbar spine good range of motion. Shoulder joints although joints are good range of motion. She had limited range of motion of bilateral wrist joints with some synovial thickening she has synovial thickening over right second MCP joint. Hip joints knee joints ankles MTPs PIPs are good range of motion with no synovitis.  CDAI Exam: CDAI Homunculus Exam:   Tenderness:  Right hand: 2nd MCP  Joint Counts:  CDAI Tender Joint count: 1 CDAI Swollen Joint count: 0  Global Assessments:  Patient Global Assessment: 1 Provider Global Assessment: 2  CDAI Calculated Score: 4    Investigation: No additional findings.  CBC Latest Ref Rng & Units 08/18/2016 03/17/2016 10/30/2013  WBC 3.8 - 10.8 K/uL 6.7 6.6 7.7  Hemoglobin 11.7 - 15.5 g/dL 12.2 12.1 11.7(L)  Hematocrit 35.0 - 45.0 % 37.7 36.5 34.2(L)  Platelets 140 - 400 K/uL 459(H) 428(H) 429(H)    CMP Latest Ref Rng & Units 08/18/2016 03/17/2016 02/10/2015  Glucose 65 - 99 mg/dL 109(H) 91 78  BUN 7 - 25 mg/dL _0 Creatinine 0.50 - 1.05 mg/dL 0.82 0.87 0.71  Sodium 135 - 146 mmol/L 137 139 138  Potassium 3.5 - 5.3 mmol/L 4.2 4.2 3.7  Chloride 98 - 110 mmol/L 105 105 103  CO2 20 - 31 mmol/L _1 Calcium 8.6 - 10.4 mg/dL 9.9 9.4 9.7  Total Protein 6.1 - 8.1 g/dL 7.9 7.4 8.2(H)  Total Bilirubin 0.2 - 1.2 mg/dL 0.6 0.6 0.7  Alkaline Phos 33 - 130 U/L 56 50 57  AST 10 - 35 U/L _2 ALT 6 - 29 U/L _3 Imaging: No results found.  Speciality Comments: No specialty comments available.    Procedures:  No procedures performed Allergies: Patient has no known allergies.   Assessment / Plan:     Visit Diagnoses: Rheumatoid arthritis with rheumatoid factor - Positive RF, positive anti-CCP, elevated ESR, erosive disease. According to patient  she is clinically doing well with no joint inflammation or discomfort. She has some synovial thickening in her right second MCP joint and also decreased range of motion in her wrist joints. I will obtain x-ray of bilateral hands and bilateral feet for comparison. I'll also schedule ultrasound of bilateral hands to evaluate for any underlying synovitis in future.  High risk medication use - Methotrexate 0.8 ML subcutaneous every week, folic acid 2 mg by mouth daily. Her labs are stable we'll continue to monitor her labs every 3 months.  Chronic pain of left knee: Resolved  Hyperglycemia  BMI 36.0-36.9,adult: Weight loss diet and exercise discussed  History of anxiety    Orders: Orders Placed This Encounter  Procedures  . XR Hand 2 View Right  . XR Hand 2 View Left  . XR Foot 2 Views Right  . XR Foot 2 Views Left   No orders of the defined types were placed in this encounter.   Face-to-face time spent with patient was 30 minutes. 50% of time was  spent in counseling and coordination of care.  Follow-Up Instructions: Return in about 5 months (around 01/23/2017) for Rheumatoid arthritis.   Bo Merino, MD  Note - This record has been created using Editor, commissioning.  Chart creation errors have been sought, but may not always  have been located. Such creation errors do not reflect on  the standard of medical care.

## 2016-08-23 ENCOUNTER — Ambulatory Visit (INDEPENDENT_AMBULATORY_CARE_PROVIDER_SITE_OTHER): Payer: Medicare Other

## 2016-08-23 ENCOUNTER — Encounter: Payer: Self-pay | Admitting: Rheumatology

## 2016-08-23 ENCOUNTER — Ambulatory Visit (INDEPENDENT_AMBULATORY_CARE_PROVIDER_SITE_OTHER): Payer: Self-pay

## 2016-08-23 ENCOUNTER — Ambulatory Visit (INDEPENDENT_AMBULATORY_CARE_PROVIDER_SITE_OTHER): Payer: Medicare Other | Admitting: Rheumatology

## 2016-08-23 VITALS — BP 158/84 | HR 96 | Resp 14 | Ht 66.0 in | Wt 239.0 lb

## 2016-08-23 DIAGNOSIS — Z6836 Body mass index (BMI) 36.0-36.9, adult: Secondary | ICD-10-CM | POA: Diagnosis not present

## 2016-08-23 DIAGNOSIS — Z79899 Other long term (current) drug therapy: Secondary | ICD-10-CM | POA: Diagnosis not present

## 2016-08-23 DIAGNOSIS — M0579 Rheumatoid arthritis with rheumatoid factor of multiple sites without organ or systems involvement: Secondary | ICD-10-CM

## 2016-08-23 DIAGNOSIS — Z8659 Personal history of other mental and behavioral disorders: Secondary | ICD-10-CM

## 2016-08-23 DIAGNOSIS — R739 Hyperglycemia, unspecified: Secondary | ICD-10-CM

## 2016-08-23 NOTE — Patient Instructions (Signed)
Standing Labs We placed an order today for your standing lab work.    Please come back and get your standing labs in August and every 3 months  We have open lab Monday through Friday from 8:30-11:30 AM and 1:30-4 PM at the office of Dr. Khayden Herzberg/Naitik Panwala, PA.   The office is located at [address], [address], [place], [place] [address] No appointment is necessary.   Labs are drawn by [facility].  You may receive a bill from [facility] for your lab work.    

## 2016-09-27 ENCOUNTER — Encounter: Payer: BC Managed Care – PPO | Admitting: Physician Assistant

## 2016-10-03 NOTE — Progress Notes (Signed)
Visit Diagnoses: Rheumatoid arthritis with rheumatoid factor - Positive RF, positive anti-CCP, elevated ESR, erosive disease. According to patient she is clinically doing well with no joint inflammation or discomfort. She has some synovial thickening in her right second MCP joint and also decreased range of motion in her wrist joints. I will obtain x-ray of bilateral hands and bilateral feet for comparison. I'll also schedule ultrasound of bilateral hands to evaluate for any underlying synovitis in future.  High risk medication use - Methotrexate 0.8 ML subcutaneous every week, folic acid 2 mg by mouth daily. Her labs are stable we'll continue to monitor her labs every 3 months.  Chronic pain of left knee: Resolved  Hyperglycemia  BMI 36.0-36.9,adult: Weight loss diet and exercise discussed  History of anxiety

## 2016-10-05 ENCOUNTER — Ambulatory Visit: Payer: BC Managed Care – PPO | Admitting: Rheumatology

## 2016-10-05 ENCOUNTER — Inpatient Hospital Stay (INDEPENDENT_AMBULATORY_CARE_PROVIDER_SITE_OTHER): Payer: Medicare Other

## 2016-10-05 DIAGNOSIS — M79642 Pain in left hand: Secondary | ICD-10-CM

## 2016-10-05 DIAGNOSIS — M79641 Pain in right hand: Secondary | ICD-10-CM

## 2016-10-13 ENCOUNTER — Other Ambulatory Visit: Payer: Self-pay | Admitting: Rheumatology

## 2016-10-13 NOTE — Telephone Encounter (Signed)
Last Visit: 10/05/16 Next Visit: 01/24/17   Okay to refill per Dr. Corliss Skains

## 2016-11-01 ENCOUNTER — Encounter: Payer: BC Managed Care – PPO | Admitting: Physician Assistant

## 2016-12-13 ENCOUNTER — Telehealth: Payer: Self-pay | Admitting: Rheumatology

## 2016-12-13 NOTE — Telephone Encounter (Signed)
Patient needs a refill on MTX. Patient uses [facility] on [address].

## 2016-12-14 MED ORDER — METHOTREXATE SODIUM CHEMO INJECTION 50 MG/2ML: 20.0000 mg | INTRAMUSCULAR | 0 refills | Status: DC

## 2016-12-14 NOTE — Telephone Encounter (Signed)
Last Visit: 10/05/16 Next Visit: 01/24/17  Labs: 08/18/16 stable  Patient reminded she is due for labs  Okay to refill 30 day supply per Dr. Corliss Skains

## 2016-12-14 NOTE — Telephone Encounter (Signed)
Patient has called again regarding refill on MTX. Please call patient to advise.

## 2017-01-16 ENCOUNTER — Other Ambulatory Visit: Payer: Self-pay | Admitting: Rheumatology

## 2017-01-16 NOTE — Telephone Encounter (Signed)
Last Visit: 10/05/16 Next Visit: 01/24/17   Okay to refill per Dr. Deveshwar  

## 2017-01-17 NOTE — Progress Notes (Deleted)
Office Visit Note  Patient: Debbie Williams             Date of Birth: 10-20-1965           MRN: [id number]             PCP: Porfirio Oar, PA-C Referring: Porfirio Oar, PA-C Visit Date: 01/24/2017 Occupation: @GUAROCC @    Subjective:  No chief complaint on file.   History of Present Illness: Debbie Williams is a 51 y.o. female ***   Activities of Daily Living:  Patient reports morning stiffness for *** {minute/hour:19697}.   Patient {ACTIONS;DENIES/REPORTS:21021675::"Denies"} nocturnal pain.  Difficulty dressing/grooming: {ACTIONS;DENIES/REPORTS:21021675::"Denies"} Difficulty climbing stairs: {ACTIONS;DENIES/REPORTS:21021675::"Denies"} Difficulty getting out of chair: {ACTIONS;DENIES/REPORTS:21021675::"Denies"} Difficulty using hands for taps, buttons, cutlery, and/or writing: {ACTIONS;DENIES/REPORTS:21021675::"Denies"}   No Rheumatology ROS completed.   PMFS History:  Patient Active Problem List   Diagnosis Date Noted  . High risk medication use 03/21/2016  . Anxiety 03/21/2016  . Elevated blood pressure 10/12/2014  . Allergic rhinitis due to pollen 10/12/2014  . Rheumatoid arthritis with rheumatoid factor of multiple sites without organ or systems involvement ([facility]) 09/18/2014  . GAD (generalized anxiety disorder) 09/18/2014  . Left knee pain 09/18/2014  . Hyperglycemia 09/18/2014  . BMI 36.0-36.9,adult 09/18/2014    Past Medical History:  Diagnosis Date  . Anxiety   . Arthritis    Rheumatoid  . Depression     Family History  Problem Relation Age of Onset  . Hypertension Mother   . Arthritis Mother   . Diabetes Maternal Grandmother   . Heart disease Maternal Grandmother   . Arthritis Maternal Grandmother    Past Surgical History:  Procedure Laterality Date  . INTRAUTERINE DEVICE INSERTION  09/2012   Mirena   Social History   Social History Narrative   Separated from her husband, though they share a home for now.   Their  daughter lives with them.     Objective: Vital Signs: There were no vitals taken for this visit.   Physical Exam   Musculoskeletal Exam: ***  CDAI Exam: No CDAI exam completed.    Investigation: No additional findings. CBC Latest Ref Rng & Units 08/18/2016 03/17/2016 10/30/2013  WBC 3.8 - 10.8 K/uL 6.7 6.6 7.7  Hemoglobin 11.7 - 15.5 g/dL 11/01/2013 62.6 11.7(L)  Hematocrit 35.0 - 45.0 % 37.7 36.5 34.2(L)  Platelets 140 - 400 K/uL 459(H) 428(H) 429(H)   CMP Latest Ref Rng & Units 08/18/2016 03/17/2016 02/10/2015  Glucose 65 - 99 mg/dL 02/12/2015) 91 78  BUN 7 - 25 mg/dL 12 10 11   Creatinine 0.50 - 1.05 mg/dL 546(E 7.03  Sodium 135 - 146 mmol/L 137 139 138  Potassium 3.5 - 5.3 mmol/L 4.2 4.2 3.7  Chloride 98 - 110 mmol/L 105 105 103  CO2 20 - 31 mmol/L 22 23 23   Calcium 8.6 - 10.4 mg/dL 9.9 9.4 9.7  Total Protein 6.1 - 8.1 g/dL 7.9 7.4 5.00)  Total Bilirubin 0.2 - 1.2 mg/dL 0.6 0.6 0.7  Alkaline Phos 33 - 130 U/L 56 50 57  AST 10 - 35 U/L 20 22 22   ALT 6 - 29 U/L 20 22 19     Imaging: No results found.  Speciality Comments: No specialty comments available.    Procedures:  No procedures performed Allergies: Patient has no known allergies.   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  No orders of the defined types were placed in this encounter.   Face-to-face time spent with patient was *** minutes. 50% of time was spent in counseling and coordination of care.  Follow-Up Instructions: No Follow-up on file.   Amy Littrell, RT  Note - This record has been created using [employer].  Chart creation errors have been sought, but may not always  have been located. Such creation errors do not reflect on    the standard of medical care.

## 2017-01-24 ENCOUNTER — Ambulatory Visit: Payer: BC Managed Care – PPO | Admitting: Rheumatology

## 2017-02-02 ENCOUNTER — Other Ambulatory Visit: Payer: Self-pay

## 2017-02-02 DIAGNOSIS — Z79899 Other long term (current) drug therapy: Secondary | ICD-10-CM

## 2017-02-02 LAB — COMPLETE METABOLIC PANEL WITH GFR
AG RATIO: 1.3 (calc) (ref 1.0–2.5)
ALKALINE PHOSPHATASE (APISO): 63 U/L (ref 33–130)
ALT: 16 U/L (ref 6–29)
AST: 15 U/L (ref 10–35)
Albumin: 4.5 g/dL (ref 3.6–5.1)
BILIRUBIN TOTAL: 0.7 mg/dL (ref 0.2–1.2)
BUN: 13 mg/dL (ref 7–25)
CO2: 23 mmol/L (ref 20–32)
Calcium: 10 mg/dL (ref 8.6–10.4)
Chloride: 104 mmol/L (ref 98–110)
Creat: 0.81 mg/dL (ref 0.50–1.05)
GFR, EST AFRICAN AMERICAN: 97 mL/min/{1.73_m2} (ref >=60)
GFR, Est Non African American: 84 mL/min/{1.73_m2} (ref >=60)
GLUCOSE: 108 mg/dL — AB (ref 65–99)
Globulin: 3.5 g/dL (calc) (ref 1.9–3.7)
POTASSIUM: 4.3 mmol/L (ref 3.5–5.3)
Sodium: 137 mmol/L (ref 135–146)
TOTAL PROTEIN: 8 g/dL (ref 6.1–8.1)

## 2017-02-02 LAB — CBC WITH DIFFERENTIAL/PLATELET
BASOS ABS: 29 {cells}/uL (ref 0–200)
Basophils Relative: 0.4 %
EOS ABS: 360 {cells}/uL (ref 15–500)
Eosinophils Relative: 5 %
HCT: 38 % (ref 35.0–45.0)
Hemoglobin: 12.8 g/dL (ref 11.7–15.5)
Lymphs Abs: 3686 cells/uL (ref 850–3900)
MCH: 28.3 pg (ref 27.0–33.0)
MCHC: 33.7 g/dL (ref 32.0–36.0)
MCV: 84.1 fL (ref 80.0–100.0)
MONOS PCT: 6.8 %
MPV: 11.3 fL (ref 7.5–12.5)
NEUTROS PCT: 36.6 %
Neutro Abs: 2635 cells/uL (ref 1500–7800)
PLATELETS: 415 10*3/uL — AB (ref 140–400)
RBC: 4.52 10*6/uL (ref 3.80–5.10)
RDW: 12.5 % (ref 11.0–15.0)
TOTAL LYMPHOCYTE: 51.2 %
WBC: 7.2 10*3/uL (ref 3.8–10.8)
WBCMIX: 490 {cells}/uL (ref 200–950)

## 2017-02-03 ENCOUNTER — Telehealth: Payer: Self-pay | Admitting: *Deleted

## 2017-02-03 MED ORDER — METHOTREXATE SODIUM CHEMO INJECTION 50 MG/2ML: 20.0000 mg | INTRAMUSCULAR | 0 refills | Status: DC

## 2017-02-03 NOTE — Progress Notes (Signed)
Labs are stable.

## 2017-02-03 NOTE — Telephone Encounter (Signed)
Last Visit: 10/05/16 Next Visit: 06/07/17 Labs: 02/02/18 stable  Okay to refill per Dr. Corliss Skains

## 2017-02-03 NOTE — Telephone Encounter (Signed)
-----   Message from Pollyann Savoy, MD sent at 02/03/2017 11:08 AM EDT ----- Labs are stable.

## 2017-02-06 NOTE — Telephone Encounter (Signed)
Patient left a message stating that the pharmacy has not received her prescription for her MTX.  CB#[phone].  Thank you.

## 2017-05-07 ENCOUNTER — Other Ambulatory Visit: Payer: Self-pay | Admitting: Rheumatology

## 2017-05-08 NOTE — Telephone Encounter (Signed)
Last Visit: 10/05/16 Next Visit: 06/07/17  Okay to refill per Dr. Corliss Skains

## 2017-05-24 NOTE — Progress Notes (Deleted)
Office Visit Note  Patient: Debbie Williams             Date of Birth: [birth year]           MRN: [id number]             PCP: Porfirio Oar, PA-C Referring: Porfirio Oar, PA-C Visit Date: 06/07/2017 Occupation: @GUAROCC @    Subjective:  No chief complaint on file.   History of Present Illness: Debbie Williams is a 52 y.o. female ***   Activities of Daily Living:  Patient reports morning stiffness for *** {minute/hour:19697}.   Patient {ACTIONS;DENIES/REPORTS:21021675::"Denies"} nocturnal pain.  Difficulty dressing/grooming: {ACTIONS;DENIES/REPORTS:21021675::"Denies"} Difficulty climbing stairs: {ACTIONS;DENIES/REPORTS:21021675::"Denies"} Difficulty getting out of chair: {ACTIONS;DENIES/REPORTS:21021675::"Denies"} Difficulty using hands for taps, buttons, cutlery, and/or writing: {ACTIONS;DENIES/REPORTS:21021675::"Denies"}   No Rheumatology ROS completed.   PMFS History:  Patient Active Problem List   Diagnosis Date Noted  . High risk medication use 03/21/2016  . Anxiety 03/21/2016  . Elevated blood pressure 10/12/2014  . Allergic rhinitis due to pollen 10/12/2014  . Rheumatoid arthritis with rheumatoid factor of multiple sites without organ or systems involvement ([facility]) 09/18/2014  . GAD (generalized anxiety disorder) 09/18/2014  . Left knee pain 09/18/2014  . Hyperglycemia 09/18/2014  . BMI 36.0-36.9,adult 09/18/2014    Past Medical History:  Diagnosis Date  . Anxiety   . Arthritis    Rheumatoid  . Depression     Family History  Problem Relation Age of Onset  . Hypertension Mother   . Arthritis Mother   . Diabetes Maternal Grandmother   . Heart disease Maternal Grandmother   . Arthritis Maternal Grandmother    Past Surgical History:  Procedure Laterality Date  . INTRAUTERINE DEVICE INSERTION  09/2012   Mirena   Social History   Social History Narrative   Separated from her husband, though they share a home for now.   Their  daughter lives with them.     Objective: Vital Signs: There were no vitals taken for this visit.   Physical Exam   Musculoskeletal Exam: ***  CDAI Exam: No CDAI exam completed.    Investigation: No additional findings. CBC Latest Ref Rng & Units 02/02/2017 08/18/2016 03/17/2016  WBC 3.8 - 10.8 Thousand/uL 7.2 6.7 6.6  Hemoglobin 11.7 - 15.5 g/dL 03/19/2016 78.2 95.6  Hematocrit 35.0 - 45.0 % 38.0 37.7 36.5  Platelets 140 - 400 Thousand/uL 415(H) 459(H) 428(H)   CMP Latest Ref Rng & Units 02/02/2017 08/18/2016 03/17/2016  Glucose 65 - 99 mg/dL 03/19/2016) 086(V) 91  BUN 7 - 25 mg/dL 13 12 10   Creatinine 0.50 - 1.05 mg/dL 784(O 9.62  Sodium 135 - 146 mmol/L 137 137 139  Potassium 3.5 - 5.3 mmol/L 4.3 4.2 4.2  Chloride 98 - 110 mmol/L 104 105 105  CO2 20 - 32 mmol/L 23 22 23   Calcium 8.6 - 10.4 mg/dL 9.52 9.9 9.4  Total Protein 6.1 - 8.1 g/dL 8.0 7.9 7.4  Total Bilirubin 0.2 - 1.2 mg/dL 0.7 0.6 0.6  Alkaline Phos 33 - 130 U/L - 56 50  AST 10 - 35 U/L 15 20 22   ALT 6 - 29 U/L 16 20 22     Imaging: No results found.  Speciality Comments: No specialty comments available.    Procedures:  No procedures performed Allergies: Patient has no known allergies.   Assessment / Plan:     Visit Diagnoses: No diagnosis found.    Orders: No orders of the defined types were placed in this encounter.  No orders  of the defined types were placed in this encounter.   Face-to-face time spent with patient was *** minutes. 50% of time was spent in counseling and coordination of care.  Follow-Up Instructions: No Follow-up on file.   Earnestine Mealing, CMA  Note - This record has been created using Editor, commissioning.  Chart creation errors have been sought, but may not always  have been located. Such creation errors do not reflect on  the standard of medical care.

## 2017-06-07 ENCOUNTER — Ambulatory Visit: Payer: Medicare Other | Admitting: Rheumatology

## 2017-06-07 NOTE — Progress Notes (Signed)
Office Visit Note  Patient: Debbie Williams             Date of Birth: [birth year]           MRN: [id number]             PCP: Harrison Mons, PA-C Referring: Harrison Mons, PA-C Visit Date: 06/08/2017 Occupation: '@GUAROCC' @    Subjective:  Medication management.   History of Present Illness: Debbie Williams is a 52 y.o. female with history of seropositive rheumatoid arthritis.  She states currently she is doing fine.  She has been on methotrexate 0.8 mL subcutaneously every week.  She states she has a skipped methotrexate in between for infections.  She is also forgotten to take her dosage of methotrexate on a regular basis.  She states in the last 1 months she has been taking it on a regular basis.  She denies any joint swelling.  She states she has UTI and has appointment coming up with her GYN.  Activities of Daily Living:  Patient reports morning stiffness for 0 minutes.   Patient Denies nocturnal pain.  Difficulty dressing/grooming: Denies Difficulty climbing stairs: Denies Difficulty getting out of chair: Denies Difficulty using hands for taps, buttons, cutlery, and/or writing: Denies   Review of Systems  Constitutional: Positive for fatigue. Negative for night sweats and weakness.  HENT: Negative for mouth sores.   Eyes: Negative for dryness.  Respiratory: Negative for difficulty breathing.   Cardiovascular: Negative for swelling in legs/feet.  Gastrointestinal: Negative for abdominal pain.  Endocrine: Negative for heat intolerance.  Genitourinary: Negative for painful urination.  Musculoskeletal: Positive for morning stiffness.  Skin: Negative for rash and hair loss.  Allergic/Immunologic: Negative for susceptible to infections.  Neurological: Negative for headaches and night sweats.  Hematological: Negative for bruising/bleeding tendency.  Psychiatric/Behavioral: Positive for depressed mood. Negative for confusion. The patient is  nervous/anxious.     PMFS History:  Patient Active Problem List   Diagnosis Date Noted  . High risk medication use 03/21/2016  . Anxiety 03/21/2016  . Elevated blood pressure 10/12/2014  . Allergic rhinitis due to pollen 10/12/2014  . Rheumatoid arthritis with rheumatoid factor of multiple sites without organ or systems involvement ([place]) 09/18/2014  . GAD (generalized anxiety disorder) 09/18/2014  . Left knee pain 09/18/2014  . Hyperglycemia 09/18/2014  . BMI 36.0-36.9,adult 09/18/2014    Past Medical History:  Diagnosis Date  . Anxiety   . Arthritis    Rheumatoid  . Depression     Family History  Problem Relation Age of Onset  . Hypertension Mother   . Arthritis Mother   . Diabetes Maternal Grandmother   . Heart disease Maternal Grandmother   . Arthritis Maternal Grandmother    Past Surgical History:  Procedure Laterality Date  . INTRAUTERINE DEVICE INSERTION  09/2012   Mirena   Social History   Social History Narrative   Separated from her husband, though they share a home for now.   Their daughter lives with them.     Objective: Vital Signs: BP (!) 158/80 (BP Location: Left Arm, Patient Position: Sitting, Cuff Size: Normal)   Pulse 71   Resp 16   Ht '5\' 6"'  (1.676 m)   Wt 230 lb (104.3 kg)   BMI 37.12 kg/m    Physical Exam  Constitutional: She is oriented to person, place, and time. She appears well-developed and well-nourished.  HENT:  Head: Normocephalic and atraumatic.  Eyes: Conjunctivae and EOM are normal.  Neck: Normal range of  motion.  Cardiovascular: Normal rate, regular rhythm, normal heart sounds and intact distal pulses.  Pulmonary/Chest: Effort normal and breath sounds normal.  Abdominal: Soft. Bowel sounds are normal.  Lymphadenopathy:    She has no cervical adenopathy.  Neurological: She is alert and oriented to person, place, and time.  Skin: Skin is warm and dry. Capillary refill takes less than 2 seconds.  Psychiatric: She has a  normal mood and affect. Her behavior is normal.  Nursing note and vitals reviewed.    Musculoskeletal Exam: C-spine thoracic lumbar spine good range of motion.  Shoulder joints elbows joints wrist joints good range of motion.  She is synovial thickening over bilateral MCP joints without any active synovitis.  PIP and DIPs with good range of motion without synovitis.  She has discomfort in her left knee joint with range of motion without any warmth swelling or effusion.  She has tenderness across the MTP joints without any synovitis.  CDAI Exam: CDAI Homunculus Exam:   Tenderness:  Right hand: 2nd MCP and 3rd MCP Left hand: 2nd MCP and 3rd MCP LLE: tibiofemoral  Joint Counts:  CDAI Tender Joint count: 5 CDAI Swollen Joint count: 0  Global Assessments:  Patient Global Assessment: 5 Provider Global Assessment: 5  CDAI Calculated Score: 15    Investigation: No additional findings. CBC Latest Ref Rng & Units 02/02/2017 08/18/2016 03/17/2016  WBC 3.8 - 10.8 Thousand/uL 7.2 6.7 6.6  Hemoglobin 11.7 - 15.5 g/dL 12.8 12.2 12.1  Hematocrit 35.0 - 45.0 % 38.0 37.7 36.5  Platelets 140 - 400 Thousand/uL 415(H) 459(H) 428(H)   CMP Latest Ref Rng & Units 02/02/2017 08/18/2016 03/17/2016  Glucose 65 - 99 mg/dL 108(H) 109(H) 91  BUN 7 - 25 mg/dL '13 12 10  ' Creatinine 0.50 - 1.05 mg/dL 0.81 0.82 0.87  Sodium 135 - 146 mmol/L 137 137 139  Potassium 3.5 - 5.3 mmol/L 4.3 4.2 4.2  Chloride 98 - 110 mmol/L 104 105 105  CO2 20 - 32 mmol/L '23 22 23  ' Calcium 8.6 - 10.4 mg/dL 10.0 9.9 9.4  Total Protein 6.1 - 8.1 g/dL 8.0 7.9 7.4  Total Bilirubin 0.2 - 1.2 mg/dL 0.7 0.6 0.6  Alkaline Phos 33 - 130 U/L - 56 50  AST 10 - 35 U/L '15 20 22  ' ALT 6 - 29 U/L '16 20 22    ' Imaging: No results found.  Speciality Comments: No specialty comments available.    Procedures:  No procedures performed Allergies: Pollen extract   Assessment / Plan:     Visit Diagnoses: Rheumatoid arthritis with rheumatoid  factor of multiple sites without organ or systems involvement (HCC) - Positive RF, positive anti-CCP, elevated ESR, erosive disease.  Today she has synovial thickening on examination but no active synovitis was noted.  Patient has been taking methotrexate intermittently.  We had detailed discussion regarding the use of methotrexate.  She states that she will try to be more compliant now.  She continues to have some intermittent arthralgias.  She has left knee joint  discomfort.  She states with flares she needs to use the handicap parking placard.  As per patient's request temporary parking placard was given.  She is also applying for disability.  High risk medication use - Methotrexate 0.8 ML subcutaneous every week, folic acid 2 mg by mouth daily. - Plan: CBC with Differential/Platelet, COMPLETE METABOLIC PANEL WITH GFR.  She has been noncompliant with the labs.  We have advised her to get labs every 3 months.  History of anxiety: She has a lot of anxiety about medications we had detailed discussion regarding methotrexate.  Also had detailed discussion regarding disease process and progression of disease process without immunosuppression.  Hyperglycemia    Orders: Orders Placed This Encounter  Procedures  . CBC with Differential/Platelet  . COMPLETE METABOLIC PANEL WITH GFR   No orders of the defined types were placed in this encounter.   Face-to-face time spent with patient was 30 minutes. Greater than50% of time was spent in counseling and coordination of care.  Follow-Up Instructions: Return in about 5 months (around 11/05/2017) for Rheumatoid arthritis. Association of heart disease with rheumatoid arthritis was discussed. Need to monitor blood pressure, cholesterol, and to exercise 30-60 minutes on daily basis was discussed. Poor dental hygiene can be a predisposing factor for rheumatoid arthritis. Good dental hygiene was discussed.  Bo Merino, MD  Note - This record has been  created using Editor, commissioning.  Chart creation errors have been sought, but may not always  have been located. Such creation errors do not reflect on  the standard of medical care.

## 2017-06-08 ENCOUNTER — Ambulatory Visit (INDEPENDENT_AMBULATORY_CARE_PROVIDER_SITE_OTHER): Payer: Medicare Other | Admitting: Rheumatology

## 2017-06-08 ENCOUNTER — Encounter: Payer: Self-pay | Admitting: Rheumatology

## 2017-06-08 ENCOUNTER — Ambulatory Visit: Payer: Medicare Other | Admitting: Gynecology

## 2017-06-08 ENCOUNTER — Encounter: Payer: Self-pay | Admitting: Gynecology

## 2017-06-08 VITALS — BP 158/80 | HR 71 | Resp 16 | Ht 66.0 in | Wt 230.0 lb

## 2017-06-08 VITALS — BP 146/78

## 2017-06-08 DIAGNOSIS — N3001 Acute cystitis with hematuria: Secondary | ICD-10-CM

## 2017-06-08 DIAGNOSIS — Z79899 Other long term (current) drug therapy: Secondary | ICD-10-CM | POA: Diagnosis not present

## 2017-06-08 DIAGNOSIS — R739 Hyperglycemia, unspecified: Secondary | ICD-10-CM | POA: Diagnosis not present

## 2017-06-08 DIAGNOSIS — R300 Dysuria: Secondary | ICD-10-CM

## 2017-06-08 DIAGNOSIS — Z8659 Personal history of other mental and behavioral disorders: Secondary | ICD-10-CM | POA: Diagnosis not present

## 2017-06-08 DIAGNOSIS — M0579 Rheumatoid arthritis with rheumatoid factor of multiple sites without organ or systems involvement: Secondary | ICD-10-CM

## 2017-06-08 LAB — COMPLETE METABOLIC PANEL WITH GFR
AG Ratio: 1.3 (calc) (ref 1.0–2.5)
ALKALINE PHOSPHATASE (APISO): 72 U/L (ref 33–130)
ALT: 29 U/L (ref 6–29)
AST: 22 U/L (ref 10–35)
Albumin: 4.4 g/dL (ref 3.6–5.1)
BILIRUBIN TOTAL: 0.6 mg/dL (ref 0.2–1.2)
BUN: 16 mg/dL (ref 7–25)
CHLORIDE: 104 mmol/L (ref 98–110)
CO2: 25 mmol/L (ref 20–32)
Calcium: 10.1 mg/dL (ref 8.6–10.4)
Creat: 0.82 mg/dL (ref 0.50–1.05)
GFR, Est African American: 95 mL/min/{1.73_m2} (ref >=60)
GFR, Est Non African American: 82 mL/min/{1.73_m2} (ref >=60)
Globulin: 3.5 g/dL (calc) (ref 1.9–3.7)
Glucose, Bld: 136 mg/dL — ABNORMAL HIGH (ref 65–99)
Potassium: 4.2 mmol/L (ref 3.5–5.3)
Sodium: 138 mmol/L (ref 135–146)
Total Protein: 7.9 g/dL (ref 6.1–8.1)

## 2017-06-08 LAB — CBC WITH DIFFERENTIAL/PLATELET
BASOS ABS: 60 {cells}/uL (ref 0–200)
Basophils Relative: 0.7 %
EOS ABS: 281 {cells}/uL (ref 15–500)
EOS PCT: 3.3 %
HCT: 35.1 % (ref 35.0–45.0)
Hemoglobin: 12 g/dL (ref 11.7–15.5)
Lymphs Abs: 3205 cells/uL (ref 850–3900)
MCH: 28.4 pg (ref 27.0–33.0)
MCHC: 34.2 g/dL (ref 32.0–36.0)
MCV: 83.2 fL (ref 80.0–100.0)
MONOS PCT: 5.3 %
MPV: 11.4 fL (ref 7.5–12.5)
Neutro Abs: 4505 cells/uL (ref 1500–7800)
Neutrophils Relative %: 53 %
PLATELETS: 420 10*3/uL — AB (ref 140–400)
RBC: 4.22 10*6/uL (ref 3.80–5.10)
RDW: 12.6 % (ref 11.0–15.0)
TOTAL LYMPHOCYTE: 37.7 %
WBC mixed population: 451 cells/uL (ref 200–950)
WBC: 8.5 10*3/uL (ref 3.8–10.8)

## 2017-06-08 NOTE — Patient Instructions (Signed)
Standing Labs We placed an order today for your standing lab work.    Please come back and get your standing labs in May and every 3 months  We have open lab Monday through Friday from 8:30-11:30 AM and 1:30-4 PM at the office of Dr. Lekeshia Kram.   The office is located at [address], [address], [place],  [address] No appointment is necessary.   Labs are drawn by [facility].  You may receive a bill from [facility] for your lab work. If you have any questions regarding directions or hours of operation,  please call [phone].    

## 2017-06-08 NOTE — Patient Instructions (Signed)
Take the prescribed antibiotic twice daily for 7 days.  Follow-up if the symptoms persist, worsen or recur.

## 2017-06-08 NOTE — Progress Notes (Signed)
    Debbie Williams 10/20/1965 [id number]        52 y.o.  [id number] presents with 1 week history of increasing bladder pressure, frequency, urgency and initially cloudy urine but now bloody urine.  No low back pain fever or chills.  No urinary symptoms such as discharge itching or irritation/odor.  No nausea vomiting diarrhea constipation  Past medical history,surgical history, problem list, medications, allergies, family history and social history were all reviewed and documented in the EPIC chart.  Directed ROS with pertinent positives and negatives documented in the history of present illness/assessment and plan.  Exam: Vitals:   06/08/17 1601  BP: (!) 146/78   General appearance:  Normal Spine straight without CVA tenderness Abdomen soft nontender without masses guarding rebound  Assessment/Plan:  52 y.o. [id number] with classic history for UTI.  Urine analysis consistent with UTI showing packed WBC 20-40 RBC and many bacteria.  Will treat with Macrobid 100 mg twice daily times 7 days.  OTC Azo-Standard for discomfort.  Follow-up if symptoms persist, worsen or recur.  Blood pressure remains elevated as it has in the past.  Need to follow-up with primary physician for management.    Dara Lords MD, 4:12 PM 06/08/2017

## 2017-06-09 ENCOUNTER — Telehealth: Payer: Self-pay | Admitting: *Deleted

## 2017-06-09 MED ORDER — NITROFURANTOIN MONOHYD MACRO 100 MG PO CAPS: 100.0000 mg | ORAL_CAPSULE | Freq: Two times a day (BID) | ORAL | 0 refills | Status: DC

## 2017-06-09 NOTE — Telephone Encounter (Signed)
Pt informed with them below, pt Rx for Macrobid 100 mg twice daily x 7 days was never sent to pharmacy from [facility] 06/08/17 as well. Rx sent.

## 2017-06-09 NOTE — Telephone Encounter (Signed)
-----   Message from Dara Lords, MD sent at 06/08/2017  4:15 PM EST ----- Call patient to remind her that her blood pressure was elevated at her visit and I reviewed her chart and it was also elevated at other doctors visits.  She needs to make sure that she follows up with her primary physician to follow this and manage as needed.

## 2017-06-13 LAB — URINALYSIS, COMPLETE W/RFL CULTURE
BILIRUBIN URINE: NEGATIVE
Glucose, UA: NEGATIVE
Hyaline Cast: NONE SEEN /LPF
Ketones, ur: NEGATIVE
NITRITES URINE, INITIAL: POSITIVE — AB
PH: 5.5 (ref 5.0–8.0)
SPECIFIC GRAVITY, URINE: 1.02 (ref 1.001–1.03)

## 2017-06-13 LAB — CULTURE INDICATED

## 2017-06-13 LAB — URINE CULTURE
MICRO NUMBER: 90243327
SPECIMEN QUALITY: ADEQUATE

## 2017-06-29 ENCOUNTER — Encounter: Payer: BC Managed Care – PPO | Admitting: Gynecology

## 2017-07-04 ENCOUNTER — Telehealth: Payer: Self-pay | Admitting: Rheumatology

## 2017-07-04 MED ORDER — METHOTREXATE SODIUM CHEMO INJECTION 50 MG/2ML: 20.0000 mg | INTRAMUSCULAR | 0 refills | Status: DC

## 2017-07-04 NOTE — Telephone Encounter (Signed)
Last Visit: 06/08/17 Next Visit: 11/08/17 Labs: 06/08/17 plt count stable. Glucose is elevated. All other labs are WNL.  Okay to refill per Dr. Corliss Skains

## 2017-07-04 NOTE — Telephone Encounter (Signed)
Patient called requesting prescription refill of Methotrexate.  Patient's pharmacy is [facility] on [address] in [place]

## 2017-07-05 ENCOUNTER — Encounter: Payer: Self-pay | Admitting: Gynecology

## 2017-07-11 ENCOUNTER — Encounter: Payer: Medicare Other | Admitting: Gynecology

## 2017-07-24 ENCOUNTER — Encounter: Payer: Self-pay | Admitting: Physician Assistant

## 2017-07-25 ENCOUNTER — Other Ambulatory Visit: Payer: Self-pay | Admitting: Rheumatology

## 2017-07-25 NOTE — Telephone Encounter (Signed)
Last Visit: 06/08/17 Next visit: 11/08/17 Labs: 06/08/17 plt count stable. Glucose is elevated. All other labs are WNL.  Okay to refill per Dr. Corliss Skains

## 2017-07-31 ENCOUNTER — Other Ambulatory Visit: Payer: Self-pay | Admitting: Rheumatology

## 2017-07-31 NOTE — Telephone Encounter (Signed)
Last Visit: 06/08/17 Next visit: 11/08/17.  Okay to refill per Dr. Corliss Skains

## 2017-08-02 ENCOUNTER — Encounter: Payer: Self-pay | Admitting: Gynecology

## 2017-08-02 ENCOUNTER — Ambulatory Visit (INDEPENDENT_AMBULATORY_CARE_PROVIDER_SITE_OTHER): Payer: Medicare Other | Admitting: Gynecology

## 2017-08-02 VITALS — BP 144/80 | Ht 66.0 in | Wt 230.0 lb

## 2017-08-02 DIAGNOSIS — Z30431 Encounter for routine checking of intrauterine contraceptive device: Secondary | ICD-10-CM

## 2017-08-02 DIAGNOSIS — N926 Irregular menstruation, unspecified: Secondary | ICD-10-CM | POA: Diagnosis not present

## 2017-08-02 DIAGNOSIS — Z01419 Encounter for gynecological examination (general) (routine) without abnormal findings: Secondary | ICD-10-CM

## 2017-08-02 DIAGNOSIS — Z1151 Encounter for screening for human papillomavirus (HPV): Secondary | ICD-10-CM

## 2017-08-02 NOTE — Progress Notes (Signed)
    Debbie Williams [birth year] [id number]        52 y.o.  [id number] for annual gynecologic exam.  Also to discuss her IUD management in the perimenopause.  Past medical history,surgical history, problem list, medications, allergies, family history and social history were all reviewed and documented as reviewed in the EPIC chart.  ROS:  Performed with pertinent positives and negatives included in the history, assessment and plan.   Additional significant findings : None   Exam: Kennon Portela assistant Vitals:   08/02/17 1059  BP: (!) 144/80  Weight: 230 lb (104.3 kg)  Height: 5\' 6"  (1.676 m)   Body mass index is 37.12 kg/m.  General appearance:  Normal affect, orientation and appearance. Skin: Grossly normal HEENT: Without gross lesions.  No cervical or supraclavicular adenopathy. Thyroid normal.  Lungs:  Clear without wheezing, rales or rhonchi Cardiac: RR, without RMG Abdominal:  Soft, nontender, without masses, guarding, rebound, organomegaly or hernia Breasts:  Examined lying and sitting without masses, retractions, discharge or axillary adenopathy. Pelvic:  Ext, BUS, Vagina: Normal  Cervix: Normal.  IUD string visualized.  Pap smear/HPV  Uterus: Anteverted, normal size, shape and contour, midline and mobile nontender   Adnexa: Without masses or tenderness    Anus and perineum: Normal   Rectovaginal: Normal sphincter tone without palpated masses or tenderness.    Assessment/Plan:  52 y.o. 43 female for annual gynecologic exam without menses, Mirena IUD.   1. Mirena IUD 09/2012.  Patient due to have her Mirena IUD replaced.  Is not having menopausal symptoms such as hot flushes or sweats.  Does have occasional sporadic bleeding but no regular menses.  We reviewed the perimenopause time.  And what to expect.  The issues as to whether to replace her IUD now or remove it, use backup contraception and monitor for menses/menopausal symptoms also reviewed.  Options to  check [facility] and if normal then replaced the IUD.  If elevated then the issue as to whether to replace now to cover her through the perimenopause or remove the IUD and monitor also discussed.  He should wants to go ahead and check [facility] and then follow-up for these results and then decide about the IUD management. 2. Pap smear/HPV 2014.  Pap smear/HPV today.  No history of abnormal Pap smears previously.  Will continue with every 5-year Pap smear/HPV per current screening guidelines 3. Mammography 01/2015.  Need to schedule mammogram now.  Names and numbers provided.  Breast exam normal today. 4. Colonoscopy never.  Need to schedule screening colonoscopy now.  Names and numbers provided. 5. Health maintenance.  Blood pressure 144/80.  Has been elevated in the past.  Recommend patient obtain blood pressure cuff so that she is able to monitor her blood pressure at home.  The new guidelines of 130/80 were reviewed with her.  The need to follow-up with her primary physician if they continue 130/80 or above discussed.  No routine lab work done as patient does this elsewhere.  Follow-up for [facility] results and IUD management decision.  Follow-up in 1 year for annual exam.   [facility] MD, 11:54 AM 08/02/2017

## 2017-08-02 NOTE — Patient Instructions (Addendum)
Schedule your colonoscopy with either:  [facility]   Address: [address] [place], [place], [place] [address]  [phone]    or  [facility]  Address: [address] [place], [place], [place] [address]  [phone]    Call to Schedule your mammogram  Facilities in [place]: 1)  [facility], [phone] Phone: [phone] 2)  Dr. Yolanda Bonine at [facility] [address] Phone: [phone]     Start to monitor your blood pressure at home.  If it remains above 130/80 you will need to follow-up with your primary physician.  Office will call you with the hormone test results.

## 2017-08-03 LAB — PAP IG AND HPV HIGH-RISK: HPV DNA High Risk: NOT DETECTED

## 2017-08-03 LAB — FOLLICLE STIMULATING HORMONE: FSH: 53.8 m[IU]/mL

## 2017-09-13 ENCOUNTER — Encounter: Payer: Self-pay | Admitting: Gynecology

## 2017-09-21 ENCOUNTER — Other Ambulatory Visit: Payer: Self-pay | Admitting: Rheumatology

## 2017-09-21 NOTE — Telephone Encounter (Addendum)
Last Visit: 06/08/17 Next visit: 11/08/17 Labs: 06/08/17 plt count stable. Glucose is elevated. All other labs are WNL.  Left message for patient to advise she is due for labs.  Okay to refill 30 day supply per Dr. Corliss Skains

## 2017-10-25 NOTE — Progress Notes (Deleted)
Office Visit Note  Patient: Debbie Williams             Date of Birth: June 13, 1965           MRN: [id number]             PCP: Porfirio Oar, PA-C Referring: Porfirio Oar, PA-C Visit Date: 11/08/2017 Occupation: @GUAROCC @  Subjective:  No chief complaint on file.   History of Present Illness: Debbie Williams is a 52 y.o. female ***   Activities of Daily Living:  Patient reports morning stiffness for *** {minute/hour:19697}.   Patient {ACTIONS;DENIES/REPORTS:21021675::"Denies"} nocturnal pain.  Difficulty dressing/grooming: {ACTIONS;DENIES/REPORTS:21021675::"Denies"} Difficulty climbing stairs: {ACTIONS;DENIES/REPORTS:21021675::"Denies"} Difficulty getting out of chair: {ACTIONS;DENIES/REPORTS:21021675::"Denies"} Difficulty using hands for taps, buttons, cutlery, and/or writing: {ACTIONS;DENIES/REPORTS:21021675::"Denies"}  No Rheumatology ROS completed.   PMFS History:  Patient Active Problem List   Diagnosis Date Noted  . High risk medication use 03/21/2016  . Anxiety 03/21/2016  . Elevated blood pressure 10/12/2014  . Allergic rhinitis due to pollen 10/12/2014  . Rheumatoid arthritis with rheumatoid factor of multiple sites without organ or systems involvement (HCC) 09/18/2014  . GAD (generalized anxiety disorder) 09/18/2014  . Left knee pain 09/18/2014  . Hyperglycemia 09/18/2014  . BMI 36.0-36.9,adult 09/18/2014    Past Medical History:  Diagnosis Date  . Anxiety   . Arthritis    Rheumatoid  . Depression     Family History  Problem Relation Age of Onset  . Hypertension Mother   . Arthritis Mother   . Diabetes Maternal Grandmother   . Heart disease Maternal Grandmother   . Arthritis Maternal Grandmother    Past Surgical History:  Procedure Laterality Date  . INTRAUTERINE DEVICE INSERTION  09/2012   Mirena   Social History   Social History Narrative   Separated from her husband, though they share a home for now.   Their daughter  lives with them.    Objective: Vital Signs: There were no vitals taken for this visit.   Physical Exam   Musculoskeletal Exam: ***  CDAI Exam: No CDAI exam completed.   Investigation: No additional findings. CBC Latest Ref Rng & Units 06/08/2017 02/02/2017 08/18/2016  WBC 3.8 - 10.8 Thousand/uL 8.5 7.2 6.7  Hemoglobin 11.7 - 15.5 g/dL 10/18/2016 02.6 37.8  Hematocrit 35.0 - 45.0 % 35.1 38.0 37.7  Platelets 140 - 400 Thousand/uL 420(H) 415(H) 459(H)   CMP Latest Ref Rng & Units 06/08/2017 02/02/2017 08/18/2016  Glucose 65 - 99 mg/dL 10/18/2016) 502(D) 741(O)  BUN 7 - 25 mg/dL 16 13 12   Creatinine 0.50 - 1.05 mg/dL 878(M 7.67  Sodium 135 - 146 mmol/L 138 137 137  Potassium 3.5 - 5.3 mmol/L 4.2 4.3 4.2  Chloride 98 - 110 mmol/L 104 104 105  CO2 20 - 32 mmol/L 25 23 22   Calcium 8.6 - 10.4 mg/dL 2.09 4.70 9.9  Total Protein 6.1 - 8.1 g/dL 7.9 8.0 7.9  Total Bilirubin 0.2 - 1.2 mg/dL 0.6 0.7 0.6  Alkaline Phos 33 - 130 U/L - - 56  AST 10 - 35 U/L 22 15 20   ALT 6 - 29 U/L 29 16 20     Imaging: No results found.  Recent Labs: Lab Results  Component Value Date   WBC 8.5 06/08/2017   HGB 12.0 06/08/2017   PLT 420 (H) 06/08/2017   NA 138 06/08/2017   K 4.2 06/08/2017   CL 104 06/08/2017   CO2 25 06/08/2017   GLUCOSE 136 (H) 06/08/2017   BUN 16 06/08/2017  CREATININE 0.82 06/08/2017   BILITOT 0.6 06/08/2017   ALKPHOS 56 08/18/2016   AST 22 06/08/2017   ALT 29 06/08/2017   PROT 7.9 06/08/2017   ALBUMIN 4.3 08/18/2016   CALCIUM 10.1 06/08/2017   GFRAA 95 06/08/2017    Speciality Comments: No specialty comments available.  Procedures:  No procedures performed Allergies: Pollen extract   Assessment / Plan:     Visit Diagnoses: No diagnosis found.   Orders: No orders of the defined types were placed in this encounter.  No orders of the defined types were placed in this encounter.   Face-to-face time spent with patient was *** minutes. Greater than 50% of time was spent  in counseling and coordination of care.  Follow-Up Instructions: No follow-ups on file.   Ellen Henri, CMA  Note - This record has been created using Animal nutritionist.  Chart creation errors have been sought, but may not always  have been located. Such creation errors do not reflect on  the standard of medical care.

## 2017-11-08 ENCOUNTER — Ambulatory Visit: Payer: Medicare Other | Admitting: Rheumatology

## 2017-11-15 ENCOUNTER — Telehealth: Payer: Self-pay

## 2017-11-15 NOTE — Telephone Encounter (Signed)
Patient called to see if Mirena IUD from [employer] Patient assistance program has arrived. I checked with Selena Batten A and the IUD has arrived.  Cost of insertion is $530.00 Has Medicare but it does not cover it.  Patient is separated and raising child without child support. She is asking can you arrange payment plan with her?

## 2017-11-20 NOTE — Telephone Encounter (Signed)
Spoke with patient and informed her no payment arrangements for this type appointment per Sherrilyn Rist.  She said that she should be able to do it next mos and wants to schedule appt. I will have Lupita Leash call her to schedule.

## 2017-11-27 ENCOUNTER — Other Ambulatory Visit: Payer: Self-pay | Admitting: Rheumatology

## 2017-11-27 NOTE — Telephone Encounter (Signed)
Last visit: 06/08/2017 Next visit: 01/10/2018   Okay to refill per Dr. Corliss Skains.

## 2017-12-29 NOTE — Progress Notes (Signed)
Office Visit Note  Patient: Debbie Williams             Date of Birth: 1965/10/30           MRN: [id number]             PCP: Harrison Mons, [place] Referring: Harrison Mons, [place] Visit Date: 01/10/2018 Occupation: '@GUAROCC' @  Subjective:  Left knee pain.   History of Present Illness: Debbie Williams is a 52 y.o. female history of seropositive rheumatoid arthritis.  She states she is not having pain or discomfort in any of her joints currently except for her left knee.  She has been trying to workout to lose weight and that causes discomfort in her left knee joint.  She has not noticed any joint swelling.  Patient reports that she has not been taking methotrexate on a regular basis.  She has been skipping methotrexate intermittently.  Activities of Daily Living:  Patient reports morning stiffness for 0 minutes.   Patient Denies nocturnal pain.  Difficulty dressing/grooming: Denies Difficulty climbing stairs: Reports Difficulty getting out of chair: Denies Difficulty using hands for taps, buttons, cutlery, and/or writing: Denies  Review of Systems  Constitutional: Positive for fatigue.  HENT: Negative for mouth sores, trouble swallowing, trouble swallowing and mouth dryness.   Eyes: Negative for pain and dryness.  Respiratory: Negative for shortness of breath, wheezing and difficulty breathing.   Cardiovascular: Negative for chest pain and swelling in legs/feet.  Gastrointestinal: Negative for abdominal pain, constipation, diarrhea, nausea and vomiting.  Endocrine: Negative for increased urination.  Genitourinary: Negative for nocturia and pelvic pain.  Musculoskeletal: Positive for arthralgias and joint pain. Negative for joint swelling and morning stiffness.  Skin: Negative for rash and hair loss.  Allergic/Immunologic: Negative for susceptible to infections.  Neurological: Negative for dizziness, light-headedness, headaches, memory loss and weakness.    Hematological: Negative for bruising/bleeding tendency.  Psychiatric/Behavioral: Negative for confusion.    PMFS History:  Patient Active Problem List   Diagnosis Date Noted  . High risk medication use 03/21/2016  . Anxiety 03/21/2016  . Elevated blood pressure 10/12/2014  . Allergic rhinitis due to pollen 10/12/2014  . Rheumatoid arthritis with rheumatoid factor of multiple sites without organ or systems involvement ([place]) 09/18/2014  . GAD (generalized anxiety disorder) 09/18/2014  . Left knee pain 09/18/2014  . Hyperglycemia 09/18/2014  . BMI 36.0-36.9,adult 09/18/2014    Past Medical History:  Diagnosis Date  . Anxiety   . Arthritis    Rheumatoid  . Depression     Family History  Problem Relation Age of Onset  . Hypertension Mother   . Arthritis Mother   . Diabetes Maternal Grandmother   . Heart disease Maternal Grandmother   . Arthritis Maternal Grandmother    Past Surgical History:  Procedure Laterality Date  . INTRAUTERINE DEVICE INSERTION  09/2012   Mirena   Social History   Social History Narrative   Separated from her husband, though they share a home for now.   Their daughter lives with them.    Objective: Vital Signs: BP (!) 162/78 (BP Location: Right Arm, Patient Position: Sitting, Cuff Size: Large)   Pulse 97   Resp 14   Ht 5' 5.5" (1.664 m)   Wt 225 lb 9.6 oz (102.3 kg)   BMI 36.97 kg/m    Physical Exam  Constitutional: She is oriented to person, place, and time. She appears well-developed and well-nourished.  HENT:  Head: Normocephalic and atraumatic.  Eyes: Conjunctivae and EOM are  normal.  Neck: Normal range of motion.  Cardiovascular: Normal rate, regular rhythm, normal heart sounds and intact distal pulses.  Pulmonary/Chest: Effort normal and breath sounds normal.  Abdominal: Soft. Bowel sounds are normal.  Lymphadenopathy:    She has no cervical adenopathy.  Neurological: She is alert and oriented to person, place, and time.   Skin: Skin is warm and dry. Capillary refill takes less than 2 seconds.  Psychiatric: She has a normal mood and affect. Her behavior is normal.  Nursing note and vitals reviewed.    Musculoskeletal Exam: C-spine thoracic lumbar spine good range of motion.  Shoulder joints elbow joints are good range of motion.  She has limitation of range of motion of bilateral wrist joints.  She has synovial thickening over bilateral MCPs and PIP joints.  Hip joints in good range of motion.  She has warmth and swelling over left knee joint.  She has thickening of MTPs and PIPs.  CDAI Exam: CDAI Score: 2.4  Patient Global Assessment: 1 (mm); Provider Global Assessment: 3 (mm) Swollen: 1 ; Tender: 1  Joint Exam      Right  Left  Knee     Swollen Tender     Investigation: No additional findings.  Imaging: No results found.  Recent Labs: Lab Results  Component Value Date   WBC 6.3 01/04/2018   HGB 12.5 01/04/2018   PLT 411 (H) 01/04/2018   NA 137 01/04/2018   K 4.2 01/04/2018   CL 104 01/04/2018   CO2 24 01/04/2018   GLUCOSE 146 (H) 01/04/2018   BUN 14 01/04/2018   CREATININE 0.83 01/04/2018   BILITOT 0.7 01/04/2018   ALKPHOS 56 08/18/2016   AST 18 01/04/2018   ALT 22 01/04/2018   PROT 7.8 01/04/2018   ALBUMIN 4.3 08/18/2016   CALCIUM 10.1 01/04/2018   GFRAA 93 01/04/2018    Speciality Comments: No specialty comments available.  Procedures:  No procedures performed Allergies: Pollen extract   Assessment / Plan:     Visit Diagnoses: Rheumatoid arthritis with rheumatoid factor of multiple sites without organ or systems involvement (HCC) - Positive RF, positive anti-CCP, elevated ESR, erosive disease.  Patient has synovial thickening in her MCPs and PIP joints.  But she had warmth and swelling in her left knee joint.  After discussing treatment options and combination therapy she states that she has not been taking methotrexate on a regular basis.  She would like to take it on a more  regular basis prior to changing therapy.  Risk of untreated rheumatoid arthritis or partially treated rheumatoid arthritis was discussed.  High risk medication use - Methotrexate 0.8 ML subcutaneous every week, folic acid 2 mg by mouth daily.  Her labs are stable.  We will continue to monitor labs every 3 months.  Chronic pain of left knee -she has warmth and swelling in her left knee.  Plan: XR KNEE 3 VIEW LEFT.  The x-rays today show severe lateral compartment narrowing and moderate medial compartment narrowing which is consistent with rheumatoid arthritis and osteoarthritis overlap.  A temporary handicap placard was given.  Once her blood pressure is controlled we can do a cortisone injection.  History of anxiety  Noncompliance-patient did not take any DMARDs for many years and developed more aggressive and severe disease.  She is currently not taking methotrexate on a regular basis.  Elevated blood pressure reading-we checked her blood pressure twice today which was elevated.  Her blood pressure was also elevated last visit.  I  have advised her to contact her PCP to get her blood pressure under better control.  I have also advised her to keep a diary of her blood pressure.  Hyperglycemia -dietary modifications were discussed.  Orders: Orders Placed This Encounter  Procedures  . XR KNEE 3 VIEW LEFT   No orders of the defined types were placed in this encounter.   Face-to-face time spent with patient was 30 minutes. Greater than 50% of time was spent in counseling and coordination of care.  Follow-Up Instructions: Return in about 3 months (around 04/11/2018) for Rheumatoid arthritis.   Bo Merino, MD  Note - This record has been created using Editor, commissioning.  Chart creation errors have been sought, but may not always  have been located. Such creation errors do not reflect on  the standard of medical care.

## 2018-01-04 ENCOUNTER — Other Ambulatory Visit: Payer: Self-pay | Admitting: *Deleted

## 2018-01-04 DIAGNOSIS — Z79899 Other long term (current) drug therapy: Secondary | ICD-10-CM

## 2018-01-04 LAB — COMPLETE METABOLIC PANEL WITH GFR
AG Ratio: 1.4 (calc) (ref 1.0–2.5)
ALT: 22 U/L (ref 6–29)
AST: 18 U/L (ref 10–35)
Albumin: 4.6 g/dL (ref 3.6–5.1)
Alkaline phosphatase (APISO): 80 U/L (ref 33–130)
BUN: 14 mg/dL (ref 7–25)
CO2: 24 mmol/L (ref 20–32)
CREATININE: 0.83 mg/dL (ref 0.50–1.05)
Calcium: 10.1 mg/dL (ref 8.6–10.4)
Chloride: 104 mmol/L (ref 98–110)
GFR, EST AFRICAN AMERICAN: 93 mL/min/{1.73_m2} (ref >=60)
GFR, EST NON AFRICAN AMERICAN: 81 mL/min/{1.73_m2} (ref >=60)
GLOBULIN: 3.2 g/dL (ref 1.9–3.7)
Glucose, Bld: 146 mg/dL — ABNORMAL HIGH (ref 65–99)
Potassium: 4.2 mmol/L (ref 3.5–5.3)
SODIUM: 137 mmol/L (ref 135–146)
TOTAL PROTEIN: 7.8 g/dL (ref 6.1–8.1)
Total Bilirubin: 0.7 mg/dL (ref 0.2–1.2)

## 2018-01-04 LAB — CBC WITH DIFFERENTIAL/PLATELET
BASOS ABS: 38 {cells}/uL (ref 0–200)
BASOS PCT: 0.6 %
EOS ABS: 258 {cells}/uL (ref 15–500)
Eosinophils Relative: 4.1 %
HCT: 37.1 % (ref 35.0–45.0)
HEMOGLOBIN: 12.5 g/dL (ref 11.7–15.5)
Lymphs Abs: 3276 cells/uL (ref 850–3900)
MCH: 27.8 pg (ref 27.0–33.0)
MCHC: 33.7 g/dL (ref 32.0–36.0)
MCV: 82.6 fL (ref 80.0–100.0)
MPV: 11.5 fL (ref 7.5–12.5)
Monocytes Relative: 5.1 %
NEUTROS ABS: 2407 {cells}/uL (ref 1500–7800)
Neutrophils Relative %: 38.2 %
Platelets: 411 10*3/uL — ABNORMAL HIGH (ref 140–400)
RBC: 4.49 10*6/uL (ref 3.80–5.10)
RDW: 12.3 % (ref 11.0–15.0)
Total Lymphocyte: 52 %
WBC mixed population: 321 cells/uL (ref 200–950)
WBC: 6.3 10*3/uL (ref 3.8–10.8)

## 2018-01-05 ENCOUNTER — Other Ambulatory Visit: Payer: Self-pay | Admitting: *Deleted

## 2018-01-05 MED ORDER — METHOTREXATE SODIUM CHEMO INJECTION 50 MG/2ML: INTRAMUSCULAR | 0 refills | Status: DC

## 2018-01-05 NOTE — Telephone Encounter (Signed)
Called patient with lab results and patient states she needs a refill on MTX   Last Visit: 06/08/2017 Next visit: 01/10/2018  Labs: 01/04/18 Glucose is 146. plts stable. All other labs are WNL.  Okay to refill per Dr. Corliss Skains

## 2018-01-10 ENCOUNTER — Ambulatory Visit (INDEPENDENT_AMBULATORY_CARE_PROVIDER_SITE_OTHER): Payer: Self-pay

## 2018-01-10 ENCOUNTER — Ambulatory Visit (INDEPENDENT_AMBULATORY_CARE_PROVIDER_SITE_OTHER): Payer: Medicare Other | Admitting: Rheumatology

## 2018-01-10 ENCOUNTER — Encounter: Payer: Self-pay | Admitting: Rheumatology

## 2018-01-10 VITALS — BP 162/78 | HR 97 | Resp 14 | Ht 65.5 in | Wt 225.6 lb

## 2018-01-10 DIAGNOSIS — M0579 Rheumatoid arthritis with rheumatoid factor of multiple sites without organ or systems involvement: Secondary | ICD-10-CM | POA: Diagnosis not present

## 2018-01-10 DIAGNOSIS — M25562 Pain in left knee: Secondary | ICD-10-CM | POA: Diagnosis not present

## 2018-01-10 DIAGNOSIS — R739 Hyperglycemia, unspecified: Secondary | ICD-10-CM

## 2018-01-10 DIAGNOSIS — Z79899 Other long term (current) drug therapy: Secondary | ICD-10-CM

## 2018-01-10 DIAGNOSIS — Z9119 Patient's noncompliance with other medical treatment and regimen: Secondary | ICD-10-CM

## 2018-01-10 DIAGNOSIS — Z8659 Personal history of other mental and behavioral disorders: Secondary | ICD-10-CM | POA: Diagnosis not present

## 2018-01-10 DIAGNOSIS — R030 Elevated blood-pressure reading, without diagnosis of hypertension: Secondary | ICD-10-CM

## 2018-01-10 DIAGNOSIS — Z91199 Patient's noncompliance with other medical treatment and regimen due to unspecified reason: Secondary | ICD-10-CM

## 2018-01-10 DIAGNOSIS — G8929 Other chronic pain: Secondary | ICD-10-CM

## 2018-01-10 NOTE — Patient Instructions (Signed)
Standing Labs We placed an order today for your standing lab work.    Please come back and get your standing labs in December and every 3 months   We have open lab Monday through Friday from 8:30-11:30 AM and 1:30-4:00 PM  at the office of Dr. Ginna Schuur.   You may experience shorter wait times on Monday and Friday afternoons. The office is located at [address], [address], [place], [place] [address] No appointment is necessary.   Labs are drawn by [facility].  You may receive a bill from [facility] for your lab work. If you have any questions regarding directions or hours of operation,  please call [phone].     

## 2018-02-07 ENCOUNTER — Telehealth: Payer: Self-pay | Admitting: Rheumatology

## 2018-02-07 NOTE — Telephone Encounter (Signed)
Patient advised handicap placard is ready for pick up.  

## 2018-02-07 NOTE — Telephone Encounter (Signed)
Patient states Dr. Corliss Skains filled out a Handicapp form for her, and she has misplaced it. Patient request another form be signed. Please call patient when ready to pick up.

## 2018-03-30 ENCOUNTER — Other Ambulatory Visit: Payer: Self-pay | Admitting: Rheumatology

## 2018-03-30 NOTE — Telephone Encounter (Signed)
Last Visit: 01/10/18 Next Visit: 05/01/18 Labs: 01/04/18 Glucose is 146. plts stable. All other labs are WNL.  Okay to refill per Dr. Corliss Skains

## 2018-04-17 NOTE — Progress Notes (Deleted)
Office Visit Note  Patient: Debbie Williams             Date of Birth: Jun 09, 1965           MRN: [id number]             PCP: Porfirio Oar, PA Referring: Porfirio Oar, PA Visit Date: 05/01/2018 Occupation: @GUAROCC @  Subjective:  No chief complaint on file.   History of Present Illness: Debbie Williams is a 52 y.o. female ***   Activities of Daily Living:  Patient reports morning stiffness for *** {minute/hour:19697}.   Patient {ACTIONS;DENIES/REPORTS:21021675::"Denies"} nocturnal pain.  Difficulty dressing/grooming: {ACTIONS;DENIES/REPORTS:21021675::"Denies"} Difficulty climbing stairs: {ACTIONS;DENIES/REPORTS:21021675::"Denies"} Difficulty getting out of chair: {ACTIONS;DENIES/REPORTS:21021675::"Denies"} Difficulty using hands for taps, buttons, cutlery, and/or writing: {ACTIONS;DENIES/REPORTS:21021675::"Denies"}  No Rheumatology ROS completed.   PMFS History:  Patient Active Problem List   Diagnosis Date Noted  . High risk medication use 03/21/2016  . Anxiety 03/21/2016  . Elevated blood pressure 10/12/2014  . Allergic rhinitis due to pollen 10/12/2014  . Rheumatoid arthritis with rheumatoid factor of multiple sites without organ or systems involvement (HCC) 09/18/2014  . GAD (generalized anxiety disorder) 09/18/2014  . Left knee pain 09/18/2014  . Hyperglycemia 09/18/2014  . BMI 36.0-36.9,adult 09/18/2014    Past Medical History:  Diagnosis Date  . Anxiety   . Arthritis    Rheumatoid  . Depression     Family History  Problem Relation Age of Onset  . Hypertension Mother   . Arthritis Mother   . Diabetes Maternal Grandmother   . Heart disease Maternal Grandmother   . Arthritis Maternal Grandmother    Past Surgical History:  Procedure Laterality Date  . INTRAUTERINE DEVICE INSERTION  09/2012   Mirena   Social History   Social History Narrative   Separated from her husband, though they share a home for now.   Their daughter  lives with them.    Objective: Vital Signs: There were no vitals taken for this visit.   Physical Exam   Musculoskeletal Exam: ***  CDAI Exam: CDAI Score: Not documented Patient Global Assessment: Not documented; Provider Global Assessment: Not documented Swollen: Not documented; Tender: Not documented Joint Exam   Not documented   There is currently no information documented on the homunculus. Go to the Rheumatology activity and complete the homunculus joint exam.  Investigation: No additional findings.  Imaging: No results found.  Recent Labs: Lab Results  Component Value Date   WBC 6.3 01/04/2018   HGB 12.5 01/04/2018   PLT 411 (H) 01/04/2018   NA 137 01/04/2018   K 4.2 01/04/2018   CL 104 01/04/2018   CO2 24 01/04/2018   GLUCOSE 146 (H) 01/04/2018   BUN 14 01/04/2018   CREATININE 0.83 01/04/2018   BILITOT 0.7 01/04/2018   ALKPHOS 56 08/18/2016   AST 18 01/04/2018   ALT 22 01/04/2018   PROT 7.8 01/04/2018   ALBUMIN 4.3 08/18/2016   CALCIUM 10.1 01/04/2018   GFRAA 93 01/04/2018    Speciality Comments: No specialty comments available.  Procedures:  No procedures performed Allergies: Pollen extract   Assessment / Plan:     Visit Diagnoses: No diagnosis found.   Orders: No orders of the defined types were placed in this encounter.  No orders of the defined types were placed in this encounter.   Face-to-face time spent with patient was *** minutes. Greater than 50% of time was spent in counseling and coordination of care.  Follow-Up Instructions: No follow-ups on file.   Oaklynn Stierwalt C  Anarely Nicholls, CMA  Note - This record has been created using Editor, commissioning.  Chart creation errors have been sought, but may not always  have been located. Such creation errors do not reflect on  the standard of medical care.

## 2018-05-01 ENCOUNTER — Ambulatory Visit: Payer: Medicare Other | Admitting: Rheumatology

## 2018-05-09 ENCOUNTER — Other Ambulatory Visit: Payer: Self-pay | Admitting: Rheumatology

## 2018-05-09 NOTE — Telephone Encounter (Signed)
Last Visit: 01/10/18 Next Visit: 06/06/18  Okay to refill per Dr. Corliss Skains

## 2018-05-23 NOTE — Progress Notes (Signed)
Office Visit Note  Patient: Debbie Williams             Date of Birth: Mar 28, 1966           MRN: [id number]             PCP: Harrison Mons, PA Referring: Harrison Mons, PA Visit Date: 06/06/2018 Occupation: _0 @  Subjective:  Arthritis (Left knee pain)  Methotrexate 0.8 mL's weekly along with folic acid 2 mg daily.  Last CBC/CMP within normal limits and platelets stable on 01/04/2018.  She is overdue for labs.  CBC/CMP ordered for today and will monitor every 3 months.  Standing orders are in place.  History of Present Illness: Debbie Williams is a 53 y.o. female history of seropositive rheumatoid arthritis.  She states she has been taking methotrexate on regular basis.  She states all her joints are doing well except for her left knee joint which is a still painful.  He walks on regular basis for exercise.  She states she was recently seen by her PCP and was placed on metformin, Lipitor and enalapril.  Activities of Daily Living:  Patient reports morning stiffness for 0 none.   Patient Denies nocturnal pain.  Difficulty dressing/grooming: Denies Difficulty climbing stairs: Denies Difficulty getting out of chair: Denies Difficulty using hands for taps, buttons, cutlery, and/or writing: Denies  Review of Systems  Constitutional: Positive for fatigue. Negative for night sweats, weight gain and weight loss.  HENT: Negative for mouth sores, trouble swallowing, trouble swallowing, mouth dryness and nose dryness.   Eyes: Negative for pain, redness, visual disturbance and dryness.  Respiratory: Negative for cough, shortness of breath and difficulty breathing.   Cardiovascular: Negative for chest pain, palpitations, hypertension, irregular heartbeat and swelling in legs/feet.  Gastrointestinal: Negative for blood in stool, constipation and diarrhea.  Endocrine: Negative for excessive thirst and increased urination.  Genitourinary: Negative for difficulty  urinating and vaginal dryness.  Musculoskeletal: Positive for arthralgias, joint pain and joint swelling. Negative for myalgias, muscle weakness, morning stiffness, muscle tenderness and myalgias.  Skin: Negative for color change, rash, hair loss, skin tightness, ulcers and sensitivity to sunlight.  Allergic/Immunologic: Negative for susceptible to infections.  Neurological: Negative for dizziness, numbness, memory loss, night sweats and weakness.  Hematological: Negative for bruising/bleeding tendency and swollen glands.  Psychiatric/Behavioral: Negative for depressed mood and sleep disturbance. The patient is not nervous/anxious.     PMFS History:  Patient Active Problem List   Diagnosis Date Noted  . High risk medication use 03/21/2016  . Anxiety 03/21/2016  . Elevated blood pressure 10/12/2014  . Allergic rhinitis due to pollen 10/12/2014  . Rheumatoid arthritis with rheumatoid factor of multiple sites without organ or systems involvement ([place]) 09/18/2014  . GAD (generalized anxiety disorder) 09/18/2014  . Left knee pain 09/18/2014  . Hyperglycemia 09/18/2014  . BMI 36.0-36.9,adult 09/18/2014    Past Medical History:  Diagnosis Date  . Anxiety   . Arthritis    Rheumatoid  . Depression     Family History  Problem Relation Age of Onset  . Hypertension Mother   . Arthritis Mother   . Diabetes Maternal Grandmother   . Heart disease Maternal Grandmother   . Arthritis Maternal Grandmother    Past Surgical History:  Procedure Laterality Date  . INTRAUTERINE DEVICE INSERTION  09/2012   Mirena   Social History   Social History Narrative   Separated from her husband, though they share a home for now.   Their daughter lives with  them.   Immunization History  Administered Date(s) Administered  . Influenza,inj,Quad PF,6+ Mos 02/10/2015  . Pneumococcal Polysaccharide-23 09/18/2014  . Tdap 09/18/2014  . Zoster 01/14/2015     Objective: Vital Signs: BP (!) 159/69 (BP  Location: Right Arm, Patient Position: Sitting, Cuff Size: Normal)   Pulse 100   Resp 16   Ht 5' 6" (1.676 m)   Wt 226 lb 12.8 oz (102.9 kg)   BMI 36.61 kg/m    Physical Exam Vitals signs and nursing note reviewed.  Constitutional:      Appearance: She is well-developed.  HENT:     Head: Normocephalic and atraumatic.  Eyes:     Conjunctiva/sclera: Conjunctivae normal.  Neck:     Musculoskeletal: Normal range of motion.  Cardiovascular:     Rate and Rhythm: Normal rate and regular rhythm.     Heart sounds: Normal heart sounds.  Pulmonary:     Effort: Pulmonary effort is normal.     Breath sounds: Normal breath sounds.  Abdominal:     General: Bowel sounds are normal.     Palpations: Abdomen is soft.  Lymphadenopathy:     Cervical: No cervical adenopathy.  Skin:    General: Skin is warm and dry.     Capillary Refill: Capillary refill takes less than 2 seconds.  Neurological:     Mental Status: She is alert and oriented to person, place, and time.  Psychiatric:        Behavior: Behavior normal.      Musculoskeletal Exam: C-spine thoracic and lumbar spine good range of motion.  Shoulder joints and elbow joints with good range of motion.  She has limited range of motion of bilateral wrist joints.  She is synovial thickening over wrist joints and bilateral second and third MCP joints.  Hip joints were in good range of motion.  She has warmth in her left knee joint on palpation.  CDAI Exam: CDAI Score: 1.6  Patient Global Assessment: 3 (mm); Provider Global Assessment: 3 (mm) Swollen: 0 ; Tender: 1  Joint Exam      Right  Left  Knee      Tender     Investigation: No additional findings.  Imaging: No results found.  Recent Labs: Lab Results  Component Value Date   WBC 6.3 01/04/2018   HGB 12.5 01/04/2018   PLT 411 (H) 01/04/2018   NA 137 01/04/2018   K 4.2 01/04/2018   CL 104 01/04/2018   CO2 24 01/04/2018   GLUCOSE 146 (H) 01/04/2018   BUN 14 01/04/2018     CREATININE 0.83 01/04/2018   BILITOT 0.7 01/04/2018   ALKPHOS 56 08/18/2016   AST 18 01/04/2018   ALT 22 01/04/2018   PROT 7.8 01/04/2018   ALBUMIN 4.3 08/18/2016   CALCIUM 10.1 01/04/2018   GFRAA 93 01/04/2018   05/31/2018 labs done at [place] showed CBC normal platelets 442, CMP normal calcium 10.5 Speciality Comments: No specialty comments available.  Procedures:  No procedures performed Allergies: Grass extracts [gramineae pollens] and Pollen extract   Assessment / Plan:     Visit Diagnoses: Rheumatoid arthritis with rheumatoid factor of multiple sites without organ or systems involvement (HCC) - Positive RF, positive anti-CCP, elevated ESR, erosive disease.  Patient has some warmth in her left knee joint today.  None of the other joints showed synovitis.  She has synovial thickening in her bilateral MCP joints.  She has almost no range of motion in her right wrist joint and limited  range of motion in her left wrist.  High risk medication use - Methotrexate 0.8 ML subcutaneous every week, folic acid 2 mg by mouth daily.  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  Patient continues to have pain and discomfort in her left knee joint.  She is quite active and is trying to lose weight which is causing increased discomfort.  Patient wanted her cortisone injection but we opted not to do it due to elevated blood pressure.  Have advised her to return when her blood pressure comes down within normal range.  Elevated blood pressure reading-patient blood pressure was elevated here today.  She states she was recently placed on lisinopril.  She states her blood pressure is mostly normal with PCP at home.  History of anxiety-she feels anxious and her blood pressure is elevated today.  Hyperglycemia-patient states that she  was recently placed on metformin due to elevated blood sugar.  Weight loss diet and exercise was discussed.  Noncompliance - patient did not take any DMARDs for many years and developed more aggressive and severe disease   Orders: No orders of the defined types were placed in this encounter.  No orders of the defined types were placed in this encounter.   History of his time spent patient was 30 minutes.  Greater than 50% time spent in counseling and coordination of care.  Follow-Up Instructions: Return in about 5 months (around 11/04/2018) for Rheumatoid arthritis.   Bo Merino, MD  Note - This record has been created using Editor, commissioning.  Chart creation errors have been sought, but may not always  have been located. Such creation errors do not reflect on  the standard of medical care.

## 2018-06-06 ENCOUNTER — Ambulatory Visit: Payer: Medicare Other | Admitting: Rheumatology

## 2018-06-06 ENCOUNTER — Encounter: Payer: Self-pay | Admitting: Rheumatology

## 2018-06-06 VITALS — BP 159/69 | HR 100 | Resp 16 | Ht 66.0 in | Wt 226.8 lb

## 2018-06-06 DIAGNOSIS — Z8659 Personal history of other mental and behavioral disorders: Secondary | ICD-10-CM

## 2018-06-06 DIAGNOSIS — M0579 Rheumatoid arthritis with rheumatoid factor of multiple sites without organ or systems involvement: Secondary | ICD-10-CM | POA: Diagnosis not present

## 2018-06-06 DIAGNOSIS — R739 Hyperglycemia, unspecified: Secondary | ICD-10-CM

## 2018-06-06 DIAGNOSIS — Z91199 Patient's noncompliance with other medical treatment and regimen due to unspecified reason: Secondary | ICD-10-CM

## 2018-06-06 DIAGNOSIS — Z79899 Other long term (current) drug therapy: Secondary | ICD-10-CM | POA: Diagnosis not present

## 2018-06-06 DIAGNOSIS — M1712 Unilateral primary osteoarthritis, left knee: Secondary | ICD-10-CM | POA: Diagnosis not present

## 2018-06-06 DIAGNOSIS — R030 Elevated blood-pressure reading, without diagnosis of hypertension: Secondary | ICD-10-CM

## 2018-06-06 DIAGNOSIS — Z9119 Patient's noncompliance with other medical treatment and regimen: Secondary | ICD-10-CM

## 2018-06-06 NOTE — Patient Instructions (Signed)
Standing Labs We placed an order today for your standing lab work.    Please come back and get your standing labs in May and every 3 months  We have open lab Monday through Friday from 8:30-11:30 AM and 1:30-4:00 PM  at the office of Dr. Granite Godman.   You may experience shorter wait times on Monday and Friday afternoons. The office is located at [address], [address], [place], [place] [address] No appointment is necessary.   Labs are drawn by [facility].  You may receive a bill from [facility] for your lab work.  If you wish to have your labs drawn at another location, please call the office 24 hours in advance to send orders.  If you have any questions regarding directions or hours of operation,  please call [phone].   Just as a reminder please drink plenty of water prior to coming for your lab work. Thanks!  

## 2018-08-02 ENCOUNTER — Ambulatory Visit: Payer: BC Managed Care – PPO

## 2018-08-09 ENCOUNTER — Ambulatory Visit: Payer: BC Managed Care – PPO

## 2018-08-16 ENCOUNTER — Ambulatory Visit: Payer: BC Managed Care – PPO

## 2018-09-05 ENCOUNTER — Other Ambulatory Visit: Payer: Self-pay | Admitting: Rheumatology

## 2018-09-05 ENCOUNTER — Other Ambulatory Visit: Payer: Self-pay

## 2018-09-05 DIAGNOSIS — Z79899 Other long term (current) drug therapy: Secondary | ICD-10-CM

## 2018-09-05 NOTE — Telephone Encounter (Signed)
Last visit: 06/06/18 Next Visit: 11/06/18 Labs: 01/04/18 Glucose is 146. plts stable. All other labs are WNL.  Patient update labs today   Okay to refill MTX?

## 2018-09-05 NOTE — Telephone Encounter (Signed)
Patient had labs drawn today. Patient request refill on MTX sent to [facility] on [address] once results are back.

## 2018-09-05 NOTE — Telephone Encounter (Signed)
Patient needs labs prior to further refills.

## 2018-09-06 LAB — CBC WITH DIFFERENTIAL/PLATELET
Absolute Monocytes: 319 cells/uL (ref 200–950)
Basophils Absolute: 39 cells/uL (ref 0–200)
Basophils Relative: 0.8 %
Eosinophils Absolute: 201 cells/uL (ref 15–500)
Eosinophils Relative: 4.1 %
HCT: 37.9 % (ref 35.0–45.0)
Hemoglobin: 12.7 g/dL (ref 11.7–15.5)
Lymphs Abs: 2249 cells/uL (ref 850–3900)
MCH: 27.9 pg (ref 27.0–33.0)
MCHC: 33.5 g/dL (ref 32.0–36.0)
MCV: 83.1 fL (ref 80.0–100.0)
MPV: 11.2 fL (ref 7.5–12.5)
Monocytes Relative: 6.5 %
Neutro Abs: 2092 cells/uL (ref 1500–7800)
Neutrophils Relative %: 42.7 %
Platelets: 417 10*3/uL — ABNORMAL HIGH (ref 140–400)
RBC: 4.56 10*6/uL (ref 3.80–5.10)
RDW: 12.3 % (ref 11.0–15.0)
Total Lymphocyte: 45.9 %
WBC: 4.9 10*3/uL (ref 3.8–10.8)

## 2018-09-06 LAB — COMPLETE METABOLIC PANEL WITH GFR
AG Ratio: 1.3 (calc) (ref 1.0–2.5)
ALT: 20 U/L (ref 6–29)
AST: 20 U/L (ref 10–35)
Albumin: 4.6 g/dL (ref 3.6–5.1)
Alkaline phosphatase (APISO): 76 U/L (ref 37–153)
BUN: 13 mg/dL (ref 7–25)
CO2: 24 mmol/L (ref 20–32)
Calcium: 10.4 mg/dL (ref 8.6–10.4)
Chloride: 105 mmol/L (ref 98–110)
Creat: 0.75 mg/dL (ref 0.50–1.05)
GFR, Est African American: 105 mL/min/{1.73_m2} (ref >=60)
GFR, Est Non African American: 91 mL/min/{1.73_m2} (ref >=60)
Globulin: 3.5 g/dL (calc) (ref 1.9–3.7)
Glucose, Bld: 117 mg/dL — ABNORMAL HIGH (ref 65–99)
Potassium: 4.5 mmol/L (ref 3.5–5.3)
Sodium: 139 mmol/L (ref 135–146)
Total Bilirubin: 0.7 mg/dL (ref 0.2–1.2)
Total Protein: 8.1 g/dL (ref 6.1–8.1)

## 2018-09-06 MED ORDER — METHOTREXATE SODIUM CHEMO INJECTION 50 MG/2ML: INTRAMUSCULAR | 0 refills | Status: DC

## 2018-09-06 NOTE — Telephone Encounter (Signed)
Labs from 09/05/18 are stable.

## 2018-09-06 NOTE — Progress Notes (Signed)
Labs stable

## 2018-09-19 ENCOUNTER — Encounter: Payer: Self-pay | Admitting: Gastroenterology

## 2018-09-28 ENCOUNTER — Other Ambulatory Visit: Payer: Self-pay | Admitting: Rheumatology

## 2018-09-30 ENCOUNTER — Other Ambulatory Visit: Payer: Self-pay | Admitting: Rheumatology

## 2018-10-01 NOTE — Telephone Encounter (Signed)
Last Visit: 06/06/2018 Next Visit: 11/06/2018  Okay to refill per Dr. Estanislado Pandy.

## 2018-10-25 NOTE — Progress Notes (Deleted)
Office Visit Note  Patient: Debbie Williams             Date of Birth: 11-May-1965           MRN: [id number]             PCP: Harrison Mons, [place] Referring: Harrison Mons, [place] Visit Date: 11/06/2018 Occupation: @GUAROCC @  Subjective:  No chief complaint on file.   Methotrexate 0.8 mL every 7 days and folic acid 1 mg 2 tablets daily.  Most recent CBC/CMP stable on 09/05/2018.  Due for CBC/CMP in August and will monitor every 3 months.  Standing orders are in place.  She has received Pneumovax 23 and Zostavax vaccines.  Recommend annual flu, Prevnar 13, and Shingrix vaccine as indicated for immunosuppressant therapy.  History of Present Illness: Debbie Williams is a 53 y.o. female ***   Activities of Daily Living:  Patient reports morning stiffness for *** {minute/hour:19697}.   Patient {ACTIONS;DENIES/REPORTS:21021675::"Denies"} nocturnal pain.  Difficulty dressing/grooming: {ACTIONS;DENIES/REPORTS:21021675::"Denies"} Difficulty climbing stairs: {ACTIONS;DENIES/REPORTS:21021675::"Denies"} Difficulty getting out of chair: {ACTIONS;DENIES/REPORTS:21021675::"Denies"} Difficulty using hands for taps, buttons, cutlery, and/or writing: {ACTIONS;DENIES/REPORTS:21021675::"Denies"}  No Rheumatology ROS completed.   PMFS History:  Patient Active Problem List   Diagnosis Date Noted  . High risk medication use 03/21/2016  . Anxiety 03/21/2016  . Elevated blood pressure 10/12/2014  . Allergic rhinitis due to pollen 10/12/2014  . Rheumatoid arthritis with rheumatoid factor of multiple sites without organ or systems involvement ([place]) 09/18/2014  . GAD (generalized anxiety disorder) 09/18/2014  . Left knee pain 09/18/2014  . Hyperglycemia 09/18/2014  . BMI 36.0-36.9,adult 09/18/2014    Past Medical History:  Diagnosis Date  . Anxiety   . Arthritis    Rheumatoid  . Depression     Family History  Problem Relation Age of Onset  . Hypertension Mother   . Arthritis  Mother   . Diabetes Maternal Grandmother   . Heart disease Maternal Grandmother   . Arthritis Maternal Grandmother    Past Surgical History:  Procedure Laterality Date  . INTRAUTERINE DEVICE INSERTION  09/2012   Mirena   Social History   Social History Narrative   Separated from her husband, though they share a home for now.   Their daughter lives with them.   Immunization History  Administered Date(s) Administered  . Influenza,inj,Quad PF,6+ Mos 02/10/2015  . Pneumococcal Polysaccharide-23 09/18/2014  . Tdap 09/18/2014  . Zoster 01/14/2015     Objective: Vital Signs: There were no vitals taken for this visit.   Physical Exam   Musculoskeletal Exam: ***  CDAI Exam: CDAI Score: - Patient Global: -; Provider Global: - Swollen: -; Tender: - Joint Exam   No joint exam has been documented for this visit   There is currently no information documented on the homunculus. Go to the Rheumatology activity and complete the homunculus joint exam.  Investigation: No additional findings.  Imaging: No results found.  Recent Labs: Lab Results  Component Value Date   WBC 4.9 09/05/2018   HGB 12.7 09/05/2018   PLT 417 (H) 09/05/2018   NA 139 09/05/2018   K 4.5 09/05/2018   CL 105 09/05/2018   CO2 24 09/05/2018   GLUCOSE 117 (H) 09/05/2018   BUN 13 09/05/2018   CREATININE 0.75 09/05/2018   BILITOT 0.7 09/05/2018   ALKPHOS 56 08/18/2016   AST 20 09/05/2018   ALT 20 09/05/2018   PROT 8.1 09/05/2018   ALBUMIN 4.3 08/18/2016   CALCIUM 10.4 09/05/2018   GFRAA 105 09/05/2018  Speciality Comments: No specialty comments available.  Procedures:  No procedures performed Allergies: Grass extracts [gramineae pollens] and Pollen extract   Assessment / Plan:     Visit Diagnoses: No diagnosis found.  Orders: No orders of the defined types were placed in this encounter.  No orders of the defined types were placed in this encounter.   Face-to-face time spent with  patient was *** minutes. Greater than 50% of time was spent in counseling and coordination of care.  Follow-Up Instructions: No follow-ups on file.   Ellen Henri, CMA  Note - This record has been created using Animal nutritionist.  Chart creation errors have been sought, but may not always  have been located. Such creation errors do not reflect on  the standard of medical care.

## 2018-11-06 ENCOUNTER — Ambulatory Visit: Payer: Self-pay | Admitting: Rheumatology

## 2019-01-09 ENCOUNTER — Encounter: Payer: Self-pay | Admitting: Gynecology

## 2019-01-15 ENCOUNTER — Other Ambulatory Visit: Payer: Self-pay

## 2019-01-15 DIAGNOSIS — Z79899 Other long term (current) drug therapy: Secondary | ICD-10-CM

## 2019-01-16 ENCOUNTER — Other Ambulatory Visit: Payer: Self-pay | Admitting: Rheumatology

## 2019-01-16 LAB — CBC WITH DIFFERENTIAL/PLATELET
Absolute Monocytes: 330 cells/uL (ref 200–950)
Basophils Absolute: 33 cells/uL (ref 0–200)
Basophils Relative: 0.5 %
Eosinophils Absolute: 350 cells/uL (ref 15–500)
Eosinophils Relative: 5.3 %
HCT: 37.7 % (ref 35.0–45.0)
Hemoglobin: 12.5 g/dL (ref 11.7–15.5)
Lymphs Abs: 3188 cells/uL (ref 850–3900)
MCH: 27.9 pg (ref 27.0–33.0)
MCHC: 33.2 g/dL (ref 32.0–36.0)
MCV: 84.2 fL (ref 80.0–100.0)
MPV: 11.6 fL (ref 7.5–12.5)
Monocytes Relative: 5 %
Neutro Abs: 2699 cells/uL (ref 1500–7800)
Neutrophils Relative %: 40.9 %
Platelets: 412 10*3/uL — ABNORMAL HIGH (ref 140–400)
RBC: 4.48 10*6/uL (ref 3.80–5.10)
RDW: 12.7 % (ref 11.0–15.0)
Total Lymphocyte: 48.3 %
WBC: 6.6 10*3/uL (ref 3.8–10.8)

## 2019-01-16 LAB — COMPLETE METABOLIC PANEL WITH GFR
AG Ratio: 1.3 (calc) (ref 1.0–2.5)
ALT: 29 U/L (ref 6–29)
AST: 29 U/L (ref 10–35)
Albumin: 4.6 g/dL (ref 3.6–5.1)
Alkaline phosphatase (APISO): 75 U/L (ref 37–153)
BUN: 12 mg/dL (ref 7–25)
CO2: 22 mmol/L (ref 20–32)
Calcium: 10.2 mg/dL (ref 8.6–10.4)
Chloride: 106 mmol/L (ref 98–110)
Creat: 0.86 mg/dL (ref 0.50–1.05)
GFR, Est African American: 89 mL/min/{1.73_m2} (ref >=60)
GFR, Est Non African American: 77 mL/min/{1.73_m2} (ref >=60)
Globulin: 3.5 g/dL (calc) (ref 1.9–3.7)
Glucose, Bld: 109 mg/dL — ABNORMAL HIGH (ref 65–99)
Potassium: 4.3 mmol/L (ref 3.5–5.3)
Sodium: 140 mmol/L (ref 135–146)
Total Bilirubin: 0.7 mg/dL (ref 0.2–1.2)
Total Protein: 8.1 g/dL (ref 6.1–8.1)

## 2019-01-16 MED ORDER — METHOTREXATE SODIUM CHEMO INJECTION 50 MG/2ML: INTRAMUSCULAR | 0 refills | Status: DC

## 2019-01-16 NOTE — Telephone Encounter (Signed)
Last Visit: 06/06/2018 Next Visit: 02/07/2019 Labs: 01/15/19 stable  Okay to refill per Dr. Estanislado Pandy

## 2019-01-16 NOTE — Progress Notes (Signed)
Labs are stable.

## 2019-01-24 NOTE — Progress Notes (Deleted)
Office Visit Note  Patient: Debbie Williams             Date of Birth: 1966/02/23           MRN: [id number]             PCP: Porfirio Oar, PA Referring: Porfirio Oar, PA Visit Date: 02/07/2019 Occupation: @GUAROCC @  Subjective:  No chief complaint on file.  Methotrexate 0.8 ml every 7 days and folic acid 1 mg 2 tablets daily.  Most recent CBC/CMP within normal limits except for elevated platelets but stable on 01/15/2019.  History of Present Illness: Debbie Williams is a 53 y.o. female ***   Activities of Daily Living:  Patient reports morning stiffness for *** {minute/hour:19697}.   Patient {ACTIONS;DENIES/REPORTS:21021675::"Denies"} nocturnal pain.  Difficulty dressing/grooming: {ACTIONS;DENIES/REPORTS:21021675::"Denies"} Difficulty climbing stairs: {ACTIONS;DENIES/REPORTS:21021675::"Denies"} Difficulty getting out of chair: {ACTIONS;DENIES/REPORTS:21021675::"Denies"} Difficulty using hands for taps, buttons, cutlery, and/or writing: {ACTIONS;DENIES/REPORTS:21021675::"Denies"}  No Rheumatology ROS completed.   PMFS History:  Patient Active Problem List   Diagnosis Date Noted  . High risk medication use 03/21/2016  . Anxiety 03/21/2016  . Elevated blood pressure 10/12/2014  . Allergic rhinitis due to pollen 10/12/2014  . Rheumatoid arthritis with rheumatoid factor of multiple sites without organ or systems involvement ([facility]) 09/18/2014  . GAD (generalized anxiety disorder) 09/18/2014  . Left knee pain 09/18/2014  . Hyperglycemia 09/18/2014  . BMI 36.0-36.9,adult 09/18/2014    Past Medical History:  Diagnosis Date  . Anxiety   . Arthritis    Rheumatoid  . Depression     Family History  Problem Relation Age of Onset  . Hypertension Mother   . Arthritis Mother   . Diabetes Maternal Grandmother   . Heart disease Maternal Grandmother   . Arthritis Maternal Grandmother    Past Surgical History:  Procedure Laterality Date  . INTRAUTERINE  DEVICE INSERTION  09/2012   Mirena   Social History   Social History Narrative   Separated from her husband, though they share a home for now.   Their daughter lives with them.   Immunization History  Administered Date(s) Administered  . Influenza,inj,Quad PF,6+ Mos 02/10/2015  . Pneumococcal Polysaccharide-23 09/18/2014  . Tdap 09/18/2014  . Zoster 01/14/2015     Objective: Vital Signs: There were no vitals taken for this visit.   Physical Exam   Musculoskeletal Exam: ***  CDAI Exam: CDAI Score: - Patient Global: -; Provider Global: - Swollen: -; Tender: - Joint Exam   No joint exam has been documented for this visit   There is currently no information documented on the homunculus. Go to the Rheumatology activity and complete the homunculus joint exam.  Investigation: No additional findings.  Imaging: No results found.  Recent Labs: Lab Results  Component Value Date   WBC 6.6 01/15/2019   HGB 12.5 01/15/2019   PLT 412 (H) 01/15/2019   NA 140 01/15/2019   K 4.3 01/15/2019   CL 106 01/15/2019   CO2 22 01/15/2019   GLUCOSE 109 (H) 01/15/2019   BUN 12 01/15/2019   CREATININE 0.86 01/15/2019   BILITOT 0.7 01/15/2019   ALKPHOS 56 08/18/2016   AST 29 01/15/2019   ALT 29 01/15/2019   PROT 8.1 01/15/2019   ALBUMIN 4.3 08/18/2016   CALCIUM 10.2 01/15/2019   GFRAA 89 01/15/2019    Speciality Comments: No specialty comments available.  Procedures:  No procedures performed Allergies: Grass extracts [gramineae pollens] and Pollen extract   Assessment / Plan:     Visit Diagnoses: No  diagnosis found.  Orders: No orders of the defined types were placed in this encounter.  No orders of the defined types were placed in this encounter.   Face-to-face time spent with patient was *** minutes. Greater than 50% of time was spent in counseling and coordination of care.  Follow-Up Instructions: No follow-ups on file.   Earnestine Mealing, CMA  Note - This  record has been created using Editor, commissioning.  Chart creation errors have been sought, but may not always  have been located. Such creation errors do not reflect on  the standard of medical care.

## 2019-02-06 ENCOUNTER — Other Ambulatory Visit: Payer: Self-pay

## 2019-02-06 ENCOUNTER — Encounter: Payer: Self-pay | Admitting: Gynecology

## 2019-02-06 ENCOUNTER — Ambulatory Visit (INDEPENDENT_AMBULATORY_CARE_PROVIDER_SITE_OTHER): Payer: Medicare Other | Admitting: Gynecology

## 2019-02-06 VITALS — BP 150/84 | Ht 66.0 in | Wt 214.0 lb

## 2019-02-06 DIAGNOSIS — N912 Amenorrhea, unspecified: Secondary | ICD-10-CM

## 2019-02-06 DIAGNOSIS — Z30432 Encounter for removal of intrauterine contraceptive device: Secondary | ICD-10-CM

## 2019-02-06 DIAGNOSIS — Z01419 Encounter for gynecological examination (general) (routine) without abnormal findings: Secondary | ICD-10-CM | POA: Diagnosis not present

## 2019-02-06 NOTE — Progress Notes (Signed)
    Kelina McGee-Alharazim [birth year] [id number]        53 y.o.  [id number] for annual gynecologic exam.  Has questions about her IUD.  Past medical history,surgical history, problem list, medications, allergies, family history and social history were all reviewed and documented as reviewed in the EPIC chart.  ROS:  Performed with pertinent positives and negatives included in the history, assessment and plan.   Additional significant findings : None   Exam: Caryn Bee assistant Vitals:   02/06/19 1441 02/06/19 1512  BP: (!) 160/84 (!) 150/84  Weight: 214 lb (97.1 kg)   Height: 5\' 6"  (1.676 m)    Body mass index is 34.54 kg/m.  General appearance:  Normal affect, orientation and appearance. Skin: Grossly normal HEENT: Without gross lesions.  No cervical or supraclavicular adenopathy. Thyroid normal.  Lungs:  Clear without wheezing, rales or rhonchi Cardiac: RR, without RMG Abdominal:  Soft, nontender, without masses, guarding, rebound, organomegaly or hernia Breasts:  Examined lying and sitting without masses, retractions, discharge or axillary adenopathy. Pelvic:  Ext, BUS, Vagina: Normal  Cervix: Normal.  IUD string visualized  Uterus: Anteverted, normal size, shape and contour, midline and mobile nontender   Adnexa: Without masses or tenderness    Anus and perineum: Normal   Rectovaginal: Normal sphincter tone without palpated masses or tenderness.   Procedure: Her IUD strings were grasped with a [place] forcep and her Mirena IUD was removed, shown to the patient and discarded.  Assessment/Plan:  53 y.o. [id number] female for annual gynecologic exam.  Without menses, Mirena IUD  1. Mirena IUD 09/2012.  She understands she is overdue to have it replaced/removed.  [place] 2019 was 53.8.  Not having significant menopausal symptoms.  Her IUD was removed today as above.  I recommended the patient use backup contraception for now keep a menstrual calendar.  If remains without menses  over the next 6 months then will follow.  If she starts to have any bleeding we discussed possibly replacing her Mirena IUD for menstrual suppression and to get her through the perimenopause. 2. Pap smear/HPV 2019.  No Pap smear done today.  No history of abnormal Pap smears.  Plan repeat Pap smear/HPV at 5-year interval per current screening guidelines. 3. Mammography reported 2018.  Patient realizes she is overdue and agrees to call and schedule.  Breast exam normal today. 4. Colonoscopy never.  We reviewed second most common cancer in African-American population.  Strongly recommended patient schedule a screening colonoscopy and she agrees to do so. 5. DEXA never.  Will plan at age 86. 39. Health maintenance.  No routine lab work done as patient does this elsewhere.  Blood pressure elevated at 160/84.  She is being seen for hypertension but apparently not taking her medication.  Need to follow-up with her primary in reference to blood pressure management reviewed.  Follow-up in 1 year, sooner if any bleeding.   Anastasio Auerbach MD, 3:16 PM 02/06/2019

## 2019-02-06 NOTE — Patient Instructions (Signed)
Use backup contraception for now.  Keep a menstrual calendar and call if any bleeding.  Follow-up in 1 year for annual exam.

## 2019-02-07 ENCOUNTER — Ambulatory Visit: Payer: Self-pay | Admitting: Physician Assistant

## 2019-02-10 ENCOUNTER — Other Ambulatory Visit: Payer: Self-pay | Admitting: Rheumatology

## 2019-02-11 ENCOUNTER — Encounter: Payer: Self-pay | Admitting: Gynecology

## 2019-02-11 NOTE — Telephone Encounter (Signed)
Last Visit:06/06/2018 Next Visit: 02/26/19  Okay to refill per Dr. Estanislado Pandy

## 2019-02-12 NOTE — Progress Notes (Signed)
Office Visit Note  Patient: Debbie Williams             Date of Birth: December 26, 1965           MRN: [id number]             PCP: Harrison Mons, PA Referring: Harrison Mons, PA Visit Date: 02/26/2019 Occupation: '@GUAROCC' @  Subjective:  Left knee joint pain   History of Present Illness: Debbie Williams is a 53 y.o. female with history of seropositive rheumatoid arthritis and osteoarthritis.  Patient is taking methotrexate 0.8 mL subcutaneous injections once weekly and folic acid 2 mg by mouth daily.  She has not missed any doses of methotrexate recently.  She denies any recent rheumatoid arthritis flares.  She continues have chronic pain in the left knee joint.  She states that she notices some warmth but no swelling in the left knee.  She would like a cortisone injection today.  She would also like to apply for Visco gel injections for the left knee joint.  She denies any other joint pain or joint swelling at this time.  She denies any morning stiffness  Activities of Daily Living:  Patient reports morning stiffness for 0 none.   Patient Denies nocturnal pain.  Difficulty dressing/grooming: Denies Difficulty climbing stairs: Denies Difficulty getting out of chair: Denies Difficulty using hands for taps, buttons, cutlery, and/or writing: Denies  Review of Systems  Constitutional: Positive for fatigue.  HENT: Negative for mouth sores, mouth dryness and nose dryness.   Eyes: Negative for pain, visual disturbance and dryness.  Respiratory: Negative for cough, hemoptysis, shortness of breath and difficulty breathing.   Cardiovascular: Negative for chest pain, palpitations, hypertension and swelling in legs/feet.  Gastrointestinal: Negative for blood in stool, constipation and diarrhea.  Endocrine: Negative for increased urination.  Genitourinary: Negative for difficulty urinating and painful urination.  Musculoskeletal: Positive for arthralgias and joint pain. Negative  for joint swelling, myalgias, muscle weakness, morning stiffness, muscle tenderness and myalgias.  Skin: Negative for color change, pallor, rash, hair loss, nodules/bumps, skin tightness, ulcers and sensitivity to sunlight.  Allergic/Immunologic: Negative for susceptible to infections.  Neurological: Negative for dizziness, numbness, headaches and weakness.  Hematological: Negative for bruising/bleeding tendency and swollen glands.  Psychiatric/Behavioral: Negative for depressed mood and sleep disturbance. The patient is not nervous/anxious.     PMFS History:  Patient Active Problem List   Diagnosis Date Noted   High risk medication use 03/21/2016   Anxiety 03/21/2016   Elevated blood pressure 10/12/2014   Allergic rhinitis due to pollen 10/12/2014   Rheumatoid arthritis with rheumatoid factor of multiple sites without organ or systems involvement ([place]) 09/18/2014   GAD (generalized anxiety disorder) 09/18/2014   Left knee pain 09/18/2014   Hyperglycemia 09/18/2014   BMI 36.0-36.9,adult 09/18/2014    Past Medical History:  Diagnosis Date   Anxiety    Arthritis    Rheumatoid   Depression    Diabetes mellitus without complication ([place])     Family History  Problem Relation Age of Onset   Hypertension Mother    Arthritis Mother    Diabetes Maternal Grandmother    Heart disease Maternal Grandmother    Arthritis Maternal Grandmother    Past Surgical History:  Procedure Laterality Date   INTRAUTERINE DEVICE INSERTION  09/2012   Mirena   Social History   Social History Narrative   Separated from her husband, though they share a home for now.   Their daughter lives with them.   Immunization  History  Administered Date(s) Administered   Influenza,inj,Quad PF,6+ Mos 02/10/2015   Pneumococcal Polysaccharide-23 09/18/2014   Tdap 09/18/2014   Zoster 01/14/2015     Objective: Vital Signs: BP (!) 155/82 (BP Location: Right Arm, Patient Position: Sitting,  Cuff Size: Normal)    Pulse (!) 103    Resp 16    Ht '5\' 6"'  (1.676 m)    Wt 212 lb 12.8 oz (96.5 kg)    BMI 34.35 kg/m    Physical Exam Vitals signs and nursing note reviewed.  Constitutional:      Appearance: She is well-developed.  HENT:     Head: Normocephalic and atraumatic.  Eyes:     Conjunctiva/sclera: Conjunctivae normal.  Neck:     Musculoskeletal: Normal range of motion.  Cardiovascular:     Rate and Rhythm: Normal rate and regular rhythm.     Heart sounds: Normal heart sounds.  Pulmonary:     Effort: Pulmonary effort is normal.     Breath sounds: Normal breath sounds.  Abdominal:     General: Bowel sounds are normal.     Palpations: Abdomen is soft.  Lymphadenopathy:     Cervical: No cervical adenopathy.  Skin:    General: Skin is warm and dry.     Capillary Refill: Capillary refill takes less than 2 seconds.  Neurological:     Mental Status: She is alert and oriented to person, place, and time.  Psychiatric:        Behavior: Behavior normal.      Musculoskeletal Exam: C-spine, thoracic spine, lumbar spine good range of motion.  No midline spinal tenderness.  No SI joint tenderness.  Shoulder joints and elbow joints have good range of motion with no discomfort.  She has limited range of motion of bilateral wrist joints with synovial thickening.  MCPs, PIPs, DIPs have good range of motion with no synovitis.  She has complete fist formation bilaterally.  Right knee has good range of motion with no discomfort.  No warmth or effusion of the right knee joint noted.  Left knee has painful range of motion with warmth and inflammation.  Ankle joints have good range of motion with no tenderness or inflammation.  No tenderness of MTP joints.  CDAI Exam: CDAI Score: 1  Patient Global: 5 mm; Provider Global: 5 mm Swollen: 0 ; Tender: 0  Joint Exam   No joint exam has been documented for this visit   There is currently no information documented on the homunculus. Go to the  Rheumatology activity and complete the homunculus joint exam.  Investigation: No additional findings.  Imaging: Xr Knee 3 View Left  Result Date: 02/26/2019 Severe lateral compartment narrowing with lateral osteophytes.  No chondrocalcinosis was noted.  Moderate patellofemoral narrowing was noted. Impression: These findings are consistent with severe osteoarthritis and moderate chondromalacia patella.   Recent Labs: Lab Results  Component Value Date   WBC 6.6 01/15/2019   HGB 12.5 01/15/2019   PLT 412 (H) 01/15/2019   NA 140 01/15/2019   K 4.3 01/15/2019   CL 106 01/15/2019   CO2 22 01/15/2019   GLUCOSE 109 (H) 01/15/2019   BUN 12 01/15/2019   CREATININE 0.86 01/15/2019   BILITOT 0.7 01/15/2019   ALKPHOS 56 08/18/2016   AST 29 01/15/2019   ALT 29 01/15/2019   PROT 8.1 01/15/2019   ALBUMIN 4.3 08/18/2016   CALCIUM 10.2 01/15/2019   GFRAA 89 01/15/2019    Speciality Comments: No specialty comments available.  Procedures:  Large Joint Inj: L knee on 02/26/2019 4:05 PM Indications: pain Details: 27 G 1.5 in needle, medial approach  Arthrogram: No  Medications: 1.5 mL lidocaine 1 %; 40 mg triamcinolone acetonide 40 MG/ML Aspirate: 0 mL Outcome: tolerated well, no immediate complications Procedure, treatment alternatives, risks and benefits explained, specific risks discussed. Consent was given by the patient. Immediately prior to procedure a time out was called to verify the correct patient, procedure, equipment, support staff and site/side marked as required. Patient was prepped and draped in the usual sterile fashion.     Allergies: Grass extracts [gramineae pollens] and Pollen extract   Assessment / Plan:     Visit Diagnoses: Rheumatoid arthritis with rheumatoid factor of multiple sites without organ or systems involvement (HCC) - Positive RF, positive anti-CCP, elevated ESR, erosive disease: She presents today with acute on chronic left knee joint pain.  She has  warmth and inflammation as well as painful range of motion of the left knee joint on exam.  X-rays of the left knee were updated today.  A left knee joint cortisone injection was performed today.  We will also apply for Visco gel injections for the left knee joint.  She has no other joint pain or joint swelling at this time.  She has no morning stiffness.  Overall she is clinically doing well on methotrexate 0.8 mL once weekly and folic acid 2 mg by mouth daily.  She has not missed any doses recently and is tolerating methotrexate without any side effects.  She does not need any refills at this time.  She will continue on the current treatment regimen.  She was advised to notify us if she develops increased joint pain or joint swelling.  She will follow-up in the office in 5 months.  High risk medication use - Methotrexate 0.8 ML subcutaneous every week, folic acid 2 mg by mouth daily.  CBC and CMP were stable on 01/15/2019.  She will return for lab work in December and every 3 months to monitor for drug toxicity.  We discussed the importance of holding methotrexate if she develops any signs or symptoms of an infection and to resume once the infection has completely cleared.  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  X-rays of the left knee joint were updated today.  She continues to have chronic pain in the left knee joint.  She has warmth and inflammation on the left knee on exam.  She has painful range of motion.  She is not experiencing any mechanical symptoms at this time.  She requested a left knee joint cortisone injection today.  She tolerated the procedure well.  The procedure note was completed above.  She would like to apply for Visco gel injections in the left knee joint.  Chronic pain of left knee - She presents today with acute on chronic left knee joint pain.  She has  warmth and inflammation of the left knee on exam today.  Her left knee joint cortisone injection was performed.  She would like to apply for Visco gel injections.  X-rays of the left knee were obtained.  Plan: XR KNEE 3 VIEW LEFT  Other medical conditions are listed as follows  Hyperglycemia  History of anxiety  Noncompliance - Patient did not take any DMARDs for many years and developed more aggressive and severe disease    Orders: Orders Placed This Encounter  Procedures  Large Joint Inj: L knee   XR KNEE 3 VIEW LEFT   No orders of the defined types were placed in this encounter.   Face-to-face time spent with patient was 30 minutes. Greater than 50% of time was spent in counseling and coordination of care.  Follow-Up Instructions: Return in about 5 months (around 07/27/2019) for Rheumatoid arthritis, Osteoarthritis.   Ofilia Neas, PA-C   I examined and evaluated the patient with Hazel Sams PA.  Patient's rheumatoid arthritis is fairly well controlled on methotrexate.  She continues to have some discomfort in her left knee joint.  The x-ray today obtained revealed severe end-stage osteoarthritis.  She does not want total knee replacement.  The cortisone injection was given after different side effects were discussed.  She will be scheduled for Visco supplement injections.  Joint protection muscle strengthening and weight loss were discussed.  The plan of care was discussed as noted above.  Bo Merino, MD  Note - This record has been created using Editor, commissioning.  Chart creation errors have been sought, but may not always  have been located. Such creation errors do not reflect on  the standard of medical care.

## 2019-02-26 ENCOUNTER — Encounter: Payer: Self-pay | Admitting: Rheumatology

## 2019-02-26 ENCOUNTER — Ambulatory Visit (INDEPENDENT_AMBULATORY_CARE_PROVIDER_SITE_OTHER): Payer: Medicare Other | Admitting: Rheumatology

## 2019-02-26 ENCOUNTER — Ambulatory Visit: Payer: Self-pay

## 2019-02-26 ENCOUNTER — Other Ambulatory Visit: Payer: Self-pay

## 2019-02-26 ENCOUNTER — Telehealth: Payer: Self-pay | Admitting: *Deleted

## 2019-02-26 VITALS — BP 155/82 | HR 103 | Resp 16 | Ht 66.0 in | Wt 212.8 lb

## 2019-02-26 DIAGNOSIS — M0579 Rheumatoid arthritis with rheumatoid factor of multiple sites without organ or systems involvement: Secondary | ICD-10-CM

## 2019-02-26 DIAGNOSIS — Z79899 Other long term (current) drug therapy: Secondary | ICD-10-CM | POA: Diagnosis not present

## 2019-02-26 DIAGNOSIS — M1712 Unilateral primary osteoarthritis, left knee: Secondary | ICD-10-CM

## 2019-02-26 DIAGNOSIS — R739 Hyperglycemia, unspecified: Secondary | ICD-10-CM

## 2019-02-26 DIAGNOSIS — G8929 Other chronic pain: Secondary | ICD-10-CM | POA: Diagnosis not present

## 2019-02-26 DIAGNOSIS — M25562 Pain in left knee: Secondary | ICD-10-CM

## 2019-02-26 DIAGNOSIS — Z9119 Patient's noncompliance with other medical treatment and regimen: Secondary | ICD-10-CM

## 2019-02-26 DIAGNOSIS — Z8659 Personal history of other mental and behavioral disorders: Secondary | ICD-10-CM

## 2019-02-26 DIAGNOSIS — Z91199 Patient's noncompliance with other medical treatment and regimen due to unspecified reason: Secondary | ICD-10-CM

## 2019-02-26 MED ORDER — LIDOCAINE HCL 1 % IJ SOLN
1.5000 mL | INTRAMUSCULAR | Status: AC | PRN
  Administered 2019-02-26: 1.5 mL

## 2019-02-26 MED ORDER — TRIAMCINOLONE ACETONIDE 40 MG/ML IJ SUSP
40.0000 mg | INTRAMUSCULAR | Status: AC | PRN
  Administered 2019-02-26: 40 mg via INTRA_ARTICULAR

## 2019-02-26 NOTE — Telephone Encounter (Signed)
Please apply for visco left knee. Thank you.

## 2019-02-26 NOTE — Patient Instructions (Addendum)
Standing Labs We placed an order today for your standing lab work.    Please come back and get your standing labs in December and every 3 months   We have open lab daily Monday through Thursday from 8:30-12:30 PM and 1:30-4:30 PM and Friday from 8:30-12:30 PM and 1:30-4:00 PM at the office of Dr. Shaili Deveshwar.   You may experience shorter wait times on Monday and Friday afternoons. The office is located at [address], [address], [place], [place] [address] No appointment is necessary.   Labs are drawn by [facility].  You may receive a bill from [facility] for your lab work.  If you wish to have your labs drawn at another location, please call the office 24 hours in advance to send orders.  If you have any questions regarding directions or hours of operation,  please call [phone].   Just as a reminder please drink plenty of water prior to coming for your lab work. Thanks!   Journal for Nurse Practitioners, 15(4), 263-267. Retrieved January 22, 2018 from http://clinicalkey.com/nursing">  Knee Exercises Ask your health care provider which exercises are safe for you. Do exercises exactly as told by your health care provider and adjust them as directed. It is normal to feel mild stretching, pulling, tightness, or discomfort as you do these exercises. Stop right away if you feel sudden pain or your pain gets worse. Do not begin these exercises until told by your health care provider. Stretching and range-of-motion exercises These exercises warm up your muscles and joints and improve the movement and flexibility of your knee. These exercises also help to relieve pain and swelling. Knee extension, prone 1. Lie on your abdomen (prone position) on a bed. 2. Place your left / right knee just beyond the edge of the surface so your knee is not on the bed. You can put a towel under your left / right thigh just above your kneecap for comfort. 3. Relax your leg muscles and allow gravity to straighten  your knee (extension). You should feel a stretch behind your left / right knee. 4. Hold this position for __________ seconds. 5. Scoot up so your knee is supported between repetitions. Repeat __________ times. Complete this exercise __________ times a day. Knee flexion, active  1. Lie on your back with both legs straight. If this causes back discomfort, bend your left / right knee so your foot is flat on the floor. 2. Slowly slide your left / right heel back toward your buttocks. Stop when you feel a gentle stretch in the front of your knee or thigh (flexion). 3. Hold this position for __________ seconds. 4. Slowly slide your left / right heel back to the starting position. Repeat __________ times. Complete this exercise __________ times a day. Quadriceps stretch, prone  1. Lie on your abdomen on a firm surface, such as a bed or padded floor. 2. Bend your left / right knee and hold your ankle. If you cannot reach your ankle or pant leg, loop a belt around your foot and grab the belt instead. 3. Gently pull your heel toward your buttocks. Your knee should not slide out to the side. You should feel a stretch in the front of your thigh and knee (quadriceps). 4. Hold this position for __________ seconds. Repeat __________ times. Complete this exercise __________ times a day. Hamstring, supine 1. Lie on your back (supine position). 2. Loop a belt or towel over the ball of your left / right foot. The ball   of your foot is on the walking surface, right under your toes. 3. Straighten your left / right knee and slowly pull on the belt to raise your leg until you feel a gentle stretch behind your knee (hamstring). ? Do not let your knee bend while you do this. ? Keep your other leg flat on the floor. 4. Hold this position for __________ seconds. Repeat __________ times. Complete this exercise __________ times a day. Strengthening exercises These exercises build strength and endurance in your knee.  Endurance is the ability to use your muscles for a long time, even after they get tired. Quadriceps, isometric This exercise stretches the muscles in front of your thigh (quadriceps) without moving your knee joint (isometric). 1. Lie on your back with your left / right leg extended and your other knee bent. Put a rolled towel or small pillow under your knee if told by your health care provider. 2. Slowly tense the muscles in the front of your left / right thigh. You should see your kneecap slide up toward your hip or see increased dimpling just above the knee. This motion will push the back of the knee toward the floor. 3. For __________ seconds, hold the muscle as tight as you can without increasing your pain. 4. Relax the muscles slowly and completely. Repeat __________ times. Complete this exercise __________ times a day. Straight leg raises This exercise stretches the muscles in front of your thigh (quadriceps) and the muscles that move your hips (hip flexors). 1. Lie on your back with your left / right leg extended and your other knee bent. 2. Tense the muscles in the front of your left / right thigh. You should see your kneecap slide up or see increased dimpling just above the knee. Your thigh may even shake a bit. 3. Keep these muscles tight as you raise your leg 4-6 inches (10-15 cm) off the floor. Do not let your knee bend. 4. Hold this position for __________ seconds. 5. Keep these muscles tense as you lower your leg. 6. Relax your muscles slowly and completely after each repetition. Repeat __________ times. Complete this exercise __________ times a day. Hamstring, isometric 1. Lie on your back on a firm surface. 2. Bend your left / right knee about __________ degrees. 3. Dig your left / right heel into the surface as if you are trying to pull it toward your buttocks. Tighten the muscles in the back of your thighs (hamstring) to "dig" as hard as you can without increasing any  pain. 4. Hold this position for __________ seconds. 5. Release the tension gradually and allow your muscles to relax completely for __________ seconds after each repetition. Repeat __________ times. Complete this exercise __________ times a day. Hamstring curls If told by your health care provider, do this exercise while wearing ankle weights. Begin with __________ lb weights. Then increase the weight by 1 lb (0.5 kg) increments. Do not wear ankle weights that are more than __________ lb. 1. Lie on your abdomen with your legs straight. 2. Bend your left / right knee as far as you can without feeling pain. Keep your hips flat against the floor. 3. Hold this position for __________ seconds. 4. Slowly lower your leg to the starting position. Repeat __________ times. Complete this exercise __________ times a day. Squats This exercise strengthens the muscles in front of your thigh and knee (quadriceps). 1. Stand in front of a table, with your feet and knees pointing straight ahead. You may rest   your hands on the table for balance but not for support. 2. Slowly bend your knees and lower your hips like you are going to sit in a chair. ? Keep your weight over your heels, not over your toes. ? Keep your lower legs upright so they are parallel with the table legs. ? Do not let your hips go lower than your knees. ? Do not bend lower than told by your health care provider. ? If your knee pain increases, do not bend as low. 3. Hold the squat position for __________ seconds. 4. Slowly push with your legs to return to standing. Do not use your hands to pull yourself to standing. Repeat __________ times. Complete this exercise __________ times a day. Wall slides This exercise strengthens the muscles in front of your thigh and knee (quadriceps). 1. Lean your back against a smooth wall or door, and walk your feet out 18-24 inches (46-61 cm) from it. 2. Place your feet hip-width apart. 3. Slowly slide down  the wall or door until your knees bend __________ degrees. Keep your knees over your heels, not over your toes. Keep your knees in line with your hips. 4. Hold this position for __________ seconds. Repeat __________ times. Complete this exercise __________ times a day. Straight leg raises This exercise strengthens the muscles that rotate the leg at the hip and move it away from your body (hip abductors). 1. Lie on your side with your left / right leg in the top position. Lie so your head, shoulder, knee, and hip line up. You may bend your bottom knee to help you keep your balance. 2. Roll your hips slightly forward so your hips are stacked directly over each other and your left / right knee is facing forward. 3. Leading with your heel, lift your top leg 4-6 inches (10-15 cm). You should feel the muscles in your outer hip lifting. ? Do not let your foot drift forward. ? Do not let your knee roll toward the ceiling. 4. Hold this position for __________ seconds. 5. Slowly return your leg to the starting position. 6. Let your muscles relax completely after each repetition. Repeat __________ times. Complete this exercise __________ times a day. Straight leg raises This exercise stretches the muscles that move your hips away from the front of the pelvis (hip extensors). 1. Lie on your abdomen on a firm surface. You can put a pillow under your hips if that is more comfortable. 2. Tense the muscles in your buttocks and lift your left / right leg about 4-6 inches (10-15 cm). Keep your knee straight as you lift your leg. 3. Hold this position for __________ seconds. 4. Slowly lower your leg to the starting position. 5. Let your leg relax completely after each repetition. Repeat __________ times. Complete this exercise __________ times a day. This information is not intended to replace advice given to you by your health care provider. Make sure you discuss any questions you have with your health care  provider. Document Released: 02/16/2005 Document Revised: 01/23/2018 Document Reviewed: 01/23/2018 Elsevier Patient Education  2020 [employer]  

## 2019-02-26 NOTE — Telephone Encounter (Signed)
Submitted for VOB. 

## 2019-02-27 NOTE — Telephone Encounter (Signed)
LMOM for patient to call and schedule Synvisc injections for her left knee.   °

## 2019-02-27 NOTE — Telephone Encounter (Addendum)
Please call, and schedule for Visco series.  Schedule for Synvisc series, Lt Knee [place] to cover 100% after $50.00 co pay each visit. Policy runs to 53/79/4327 Out of Pocket met No PA required.

## 2019-03-19 ENCOUNTER — Ambulatory Visit: Payer: Medicare Other | Admitting: Physician Assistant

## 2019-03-26 ENCOUNTER — Ambulatory Visit: Payer: Medicare Other | Admitting: Physician Assistant

## 2019-04-02 ENCOUNTER — Ambulatory Visit: Payer: Medicare Other | Admitting: Physician Assistant

## 2019-04-10 ENCOUNTER — Other Ambulatory Visit: Payer: Self-pay | Admitting: Rheumatology

## 2019-04-10 NOTE — Telephone Encounter (Signed)
Last Visit: 02/26/2019 Next Visit: 07/23/2019 Labs: 01/15/2019 Labs are stable.  Okay to refill per Dr. Estanislado Pandy.

## 2019-05-17 ENCOUNTER — Telehealth: Payer: Self-pay | Admitting: Rheumatology

## 2019-05-17 NOTE — Telephone Encounter (Signed)
Patient left a message requesting a call back to discuss if it is safe for her to get the COVID vaccine. Please call to advise.

## 2019-05-17 NOTE — Telephone Encounter (Signed)
Patient advised Dr. Corliss Skains recommends the Covid vaccine. Patient advised it is not a live virus therefore she does not need to hold her medication.

## 2019-05-24 ENCOUNTER — Telehealth: Payer: Self-pay | Admitting: Rheumatology

## 2019-05-24 NOTE — Telephone Encounter (Signed)
Authorization for [employer].  Please call, and schedule Orthovisc series, Lt Knee. Buy and Bill Medication auth. # [id number] (eff. 05/23/19- 12/17/19) Deductible does not apply. Co-Pay of $40.00 each visit. Insurance will cover 100% once OOP is met ( as of date 0% of $4000.has been met)

## 2019-05-29 ENCOUNTER — Other Ambulatory Visit: Payer: Self-pay | Admitting: Rheumatology

## 2019-05-29 NOTE — Telephone Encounter (Signed)
Last Visit: 02/26/2019 Next Visit: 07/23/2019  Okay to refill per Dr. Deveshwar  

## 2019-06-24 ENCOUNTER — Ambulatory Visit: Payer: Self-pay | Admitting: Physician Assistant

## 2019-06-28 ENCOUNTER — Telehealth: Payer: Self-pay | Admitting: Rheumatology

## 2019-06-28 NOTE — Telephone Encounter (Signed)
Patient called stating she had her COVID vaccine on Tuesday, 06/25/19 and is not sure how long she needs to wait before taking her Methotrexate.  Patient requested a return call.

## 2019-06-28 NOTE — Telephone Encounter (Signed)
Patient advised she is to hold her MTX for 1 week after receiving her Covid vaccine. Patient verbalized understanding.

## 2019-07-01 ENCOUNTER — Ambulatory Visit: Payer: Self-pay | Admitting: Physician Assistant

## 2019-07-08 ENCOUNTER — Ambulatory Visit: Payer: Medicare PPO | Admitting: Physician Assistant

## 2019-07-08 ENCOUNTER — Other Ambulatory Visit: Payer: Self-pay

## 2019-07-08 DIAGNOSIS — M1712 Unilateral primary osteoarthritis, left knee: Secondary | ICD-10-CM

## 2019-07-08 MED ORDER — LIDOCAINE HCL 1 % IJ SOLN
1.5000 mL | INTRAMUSCULAR | Status: AC | PRN
  Administered 2019-07-08: 1.5 mL

## 2019-07-08 MED ORDER — HYALURONAN 30 MG/2ML IX SOSY
30.0000 mg | PREFILLED_SYRINGE | INTRA_ARTICULAR | Status: AC | PRN
  Administered 2019-07-08: 30 mg via INTRA_ARTICULAR

## 2019-07-08 NOTE — Progress Notes (Signed)
   Procedure Note  Patient: Debbie Williams             Date of Birth: 28-Dec-1965           MRN: [id number]             Visit Date: 07/08/2019  Procedures: Visit Diagnoses:  1. 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 Consent was given by the patient. Immediately prior to procedure a time out was called to verify the correct patient, procedure, equipment, support staff and site/side marked as required. Patient was prepped and draped in the usual sterile fashion.     Patient tolerated the procedure well. Aftercare was discussed.  Sherron Ales, PA-C

## 2019-07-13 ENCOUNTER — Other Ambulatory Visit: Payer: Self-pay | Admitting: Rheumatology

## 2019-07-15 ENCOUNTER — Ambulatory Visit: Payer: Medicare PPO | Admitting: Physician Assistant

## 2019-07-15 ENCOUNTER — Other Ambulatory Visit: Payer: Self-pay

## 2019-07-15 DIAGNOSIS — M1712 Unilateral primary osteoarthritis, left knee: Secondary | ICD-10-CM

## 2019-07-15 MED ORDER — LIDOCAINE HCL 1 % IJ SOLN
1.5000 mL | INTRAMUSCULAR | Status: AC | PRN
  Administered 2019-07-15: 1.5 mL

## 2019-07-15 MED ORDER — HYALURONAN 30 MG/2ML IX SOSY
30.0000 mg | PREFILLED_SYRINGE | INTRA_ARTICULAR | Status: AC | PRN
  Administered 2019-07-15: 30 mg via INTRA_ARTICULAR

## 2019-07-15 NOTE — Telephone Encounter (Addendum)
Patient advised she is due to update labs. Patient states she does not need a refill at this time. Patient states she will update after her Nigel Sloop has been completed. Patient advised we will need updated labs prior to a refill.

## 2019-07-15 NOTE — Progress Notes (Signed)
   Procedure Note  Patient: Debbie Williams             Date of Birth: 23-Nov-1965           MRN: [id number]             Visit Date: 07/15/2019  Procedures: Visit Diagnoses:  1. 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 Consent was given by the patient. Immediately prior to procedure a time out was called to verify the correct patient, procedure, equipment, support staff and site/side marked as required. Patient was prepped and draped in the usual sterile fashion.      Patient tolerated the procedure well. Aftercare was discussed.  Sherron Ales, PA-C

## 2019-07-22 ENCOUNTER — Other Ambulatory Visit: Payer: Self-pay

## 2019-07-22 ENCOUNTER — Ambulatory Visit: Payer: Medicare PPO | Admitting: Physician Assistant

## 2019-07-22 DIAGNOSIS — M1712 Unilateral primary osteoarthritis, left knee: Secondary | ICD-10-CM | POA: Diagnosis not present

## 2019-07-22 MED ORDER — LIDOCAINE HCL 1 % IJ SOLN
1.5000 mL | INTRAMUSCULAR | Status: AC | PRN
  Administered 2019-07-22: 1.5 mL

## 2019-07-22 MED ORDER — HYALURONAN 30 MG/2ML IX SOSY
30.0000 mg | PREFILLED_SYRINGE | INTRA_ARTICULAR | Status: AC | PRN
  Administered 2019-07-22: 30 mg via INTRA_ARTICULAR

## 2019-07-22 NOTE — Progress Notes (Signed)
   Procedure Note  Patient: Debbie Williams             Date of Birth: 1965-08-02           MRN: [id number]             Visit Date: 07/22/2019  Procedures: Visit Diagnoses:  1. 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 Consent was given by the patient. Immediately prior to procedure a time out was called to verify the correct patient, procedure, equipment, support staff and site/side marked as required. Patient was prepped and draped in the usual sterile fashion.      Patient tolerated the procedure well. Aftercare was discussed.  Sherron Ales, PA-C

## 2019-07-23 ENCOUNTER — Ambulatory Visit: Payer: Medicare Other | Admitting: Rheumatology

## 2019-08-27 ENCOUNTER — Other Ambulatory Visit: Payer: Self-pay | Admitting: Rheumatology

## 2019-08-27 NOTE — Telephone Encounter (Signed)
Last Visit:02/26/2019 Next Visit:09/11/2019  Okay to refill per Dr. Corliss Skains

## 2019-08-30 NOTE — Progress Notes (Signed)
Office Visit Note  Patient: Debbie Williams             Date of Birth: 31-Jan-1966           MRN: [id number]             PCP: Harrison Mons, PA Referring: Harrison Mons, PA Visit Date: 09/11/2019 Occupation: '@GUAROCC' @  Subjective:  Left knee pain   History of Present Illness: Debbie Williams is a 54 y.o. female with history of with history of seropositive rheumatoid arthritis and osteoarthritis.  She is on methotrexate 0.8 mL subcutaneous injections once weekly and folic acid 2 mg by mouth daily.  She denies any recent rheumatoid arthritis flares.  She had Visco gel injections in the left knee on 07/08/2019, 07/15/2019, and 07/22/2019.  According to the patient her left knee felt much better after the Visco gel injections.  She states that she increased her walking regimen from 1 mile daily to 2 miles daily which exacerbated her left knee joint discomfort.  She states that overall the warmth and swelling in her left knee has improved.  She has resumed walking 30 minutes daily.  She is not having any other joint pain or joint swelling at this time.  She has not had any recent infections.  Activities of Daily Living:  Patient reports morning stiffness for 0 minutes.   Patient Denies nocturnal pain.  Difficulty dressing/grooming: Denies Difficulty climbing stairs: Denies Difficulty getting out of chair: Denies Difficulty using hands for taps, buttons, cutlery, and/or writing: Denies  Review of Systems  Constitutional: Negative for fatigue.  HENT: Negative for mouth sores, mouth dryness and nose dryness.   Eyes: Negative for pain, itching, visual disturbance and dryness.  Respiratory: Negative for cough, hemoptysis, shortness of breath and difficulty breathing.   Cardiovascular: Negative for chest pain, palpitations, hypertension and swelling in legs/feet.  Gastrointestinal: Negative for blood in stool, constipation and diarrhea.  Endocrine: Negative for increased  urination.  Genitourinary: Negative for difficulty urinating and painful urination.  Musculoskeletal: Positive for joint swelling. Negative for arthralgias, joint pain, myalgias, muscle weakness, morning stiffness, muscle tenderness and myalgias.  Skin: Negative for color change, pallor, rash, hair loss, nodules/bumps, redness, skin tightness, ulcers and sensitivity to sunlight.  Allergic/Immunologic: Negative for susceptible to infections.  Neurological: Negative for dizziness, numbness, headaches, memory loss and weakness.  Hematological: Negative for bruising/bleeding tendency and swollen glands.  Psychiatric/Behavioral: Negative for depressed mood, confusion and sleep disturbance. The patient is not nervous/anxious.     PMFS History:  Patient Active Problem List   Diagnosis Date Noted  . High risk medication use 03/21/2016  . Anxiety 03/21/2016  . Elevated blood pressure 10/12/2014  . Allergic rhinitis due to pollen 10/12/2014  . Rheumatoid arthritis with rheumatoid factor of multiple sites without organ or systems involvement ([place]) 09/18/2014  . GAD (generalized anxiety disorder) 09/18/2014  . Left knee pain 09/18/2014  . Hyperglycemia 09/18/2014  . BMI 36.0-36.9,adult 09/18/2014    Past Medical History:  Diagnosis Date  . Anxiety   . Arthritis    Rheumatoid  . Depression   . Diabetes mellitus without complication ([place])     Family History  Problem Relation Age of Onset  . Hypertension Mother   . Arthritis Mother   . Diabetes Maternal Grandmother   . Heart disease Maternal Grandmother   . Arthritis Maternal Grandmother    Past Surgical History:  Procedure Laterality Date  . INTRAUTERINE DEVICE INSERTION  09/2012   Mirena   Social History  Social History Narrative   Separated from her husband, though they share a home for now.   Their daughter lives with them.   Immunization History  Administered Date(s) Administered  . Influenza,inj,Quad PF,6+ Mos 02/10/2015    . Pneumococcal Polysaccharide-23 09/18/2014  . Tdap 09/18/2014  . Zoster 01/14/2015     Objective: Vital Signs: BP (!) 174/89 (BP Location: Left Arm, Patient Position: Sitting, Cuff Size: Normal)   Pulse (!) 109   Resp 15   Ht '5\' 6"'  (1.676 m)   Wt 214 lb 9.6 oz (97.3 kg)   BMI 34.64 kg/m    Physical Exam Vitals and nursing note reviewed.  Constitutional:      Appearance: She is well-developed.  HENT:     Head: Normocephalic and atraumatic.  Eyes:     Conjunctiva/sclera: Conjunctivae normal.  Pulmonary:     Effort: Pulmonary effort is normal.  Abdominal:     General: Bowel sounds are normal.     Palpations: Abdomen is soft.  Musculoskeletal:     Cervical back: Normal range of motion.  Lymphadenopathy:     Cervical: No cervical adenopathy.  Skin:    General: Skin is warm and dry.     Capillary Refill: Capillary refill takes less than 2 seconds.  Neurological:     Mental Status: She is alert and oriented to person, place, and time.  Psychiatric:        Behavior: Behavior normal.      Musculoskeletal Exam: C-spine, thoracic spine, lumbar spine good range of motion.  No midline spinal tenderness.  Shoulder joints, elbow joints, MCPs, PIPs and DIPs good range of motion with no synovitis. Limited ROM of both wrists with synovial thickening. She has complete fist formation bilaterally.  Hip joints have good range of motion with no discomfort.  She has good range of motion of both knee joints.  Warmth of the left knee noted.  Ankle joints have good range of motion with no tenderness or inflammation.  No tenderness of MTPs.  CDAI Exam: CDAI Score: 1.4  Patient Global: 2 mm; Provider Global: 2 mm Swollen: 1 ; Tender: 0  Joint Exam 09/11/2019      Right  Left  Knee     Swollen      Investigation: No additional findings.  Imaging: No results found.  Recent Labs: Lab Results  Component Value Date   WBC 6.6 01/15/2019   HGB 12.5 01/15/2019   PLT 412 (H) 01/15/2019    NA 140 01/15/2019   K 4.3 01/15/2019   CL 106 01/15/2019   CO2 22 01/15/2019   GLUCOSE 109 (H) 01/15/2019   BUN 12 01/15/2019   CREATININE 0.86 01/15/2019   BILITOT 0.7 01/15/2019   ALKPHOS 56 08/18/2016   AST 29 01/15/2019   ALT 29 01/15/2019   PROT 8.1 01/15/2019   ALBUMIN 4.3 08/18/2016   CALCIUM 10.2 01/15/2019   GFRAA 89 01/15/2019    Speciality Comments: No specialty comments available.  Procedures:  No procedures performed Allergies: Grass extracts [gramineae pollens] and Pollen extract   Assessment / Plan:     Visit Diagnoses: Rheumatoid arthritis with rheumatoid factor of multiple sites without organ or systems involvement (HCC) - Positive RF, positive anti-CCP, elevated ESR, erosive disease: She has mild warmth of the left knee joint on exam.  No other joint synovitis was noted.  She has not had any recent rheumatoid arthritis flares.  She is clinically been doing well on methotrexate 0.8 mL subcutaneous injections once  weekly and folic acid 2 mg by mouth daily.  According to the patient she had a gap in therapy while receiving the COVID-19 vaccinations but she feels has contributed to some of her increased left knee joint pain.  She had Visco gel injections in the left knee on 07/08/2019, 07/15/2019, and 07/22/2019 which provided significant pain relief.  She has been walking 30 minutes daily for exercise.  She is not having any other joint pain or inflammation at this time.  She will continue on methotrexate 0.8 mL sq injections once weekly and folic acid 2 mg by mouth daily.  We will update lab work today and if labs are stable a refill of methotrexate will be sent to the pharmacy.  She was advised to notify us if she develops increased joint pain or joint swelling.  She will follow-up in the office in 5 months  High risk medication use - Methotrexate 0.8 ML subcutaneous every week, folic acid 2 mg by mouth daily.  CBC and CMP were drawn on 01/15/2019.  She is overdue to update  lab work today.  Orders for CBC and CMP were released.  Prescription for methotrexate pending lab work.  She has not had any recent infections.  Discussed the importance of holding methotrexate if she develops any signs or symptoms of an infection.  She has received both COVID-19 vaccinations.- Plan: CBC with Differential/Platelet, COMPLETE METABOLIC PANEL WITH GFR  Primary osteoarthritis and rheumatoid arthritis of left knee - s/p orthovisc left knee, 07/08/2019, 07/15/2019, 07/22/2019.  She notices significant improvement in the joint pain and inflammation she was experiencing in the left knee after having Visco gel injections.  Prior to receiving Visco she was walking about 1 mile on a daily basis and after Visco she was able to increase to 2 miles daily.  She states that she did have some increased discomfort after increasing her mileage and has backed off down to 30 minutes of walking daily.  She has warmth of the left knee but no effusion on exam.  We discussed the use of Voltaren gel topically.  We also discussed the importance of lower extremity muscle strengthening.  She was advised to notify us if her left knee joint pain persists or worsens.  Other medical conditions are listed as follows:  Hyperglycemia  History of anxiety  Noncompliance - Patient did not take any DMARDs for many years and developed more aggressive and severe disease   Orders: Orders Placed This Encounter  Procedures  . CBC with Differential/Platelet  . COMPLETE METABOLIC PANEL WITH GFR   No orders of the defined types were placed in this encounter.    Follow-Up Instructions: Return in about 5 months (around 02/11/2020) for Rheumatoid arthritis, Osteoarthritis.   Hazel Sams, PA-C  I examined and evaluated the patient with Hazel Sams PA.  Patient had no synovitis on my examination.  No warmth was noted in her left knee joint.  We will continue current treatment.  She had good response to Visco supplement  injections.  The plan of care was discussed as noted above.  Bo Merino, MD  Note - This record has been created using Editor, commissioning.  Chart creation errors have been sought, but may not always  have been located. Such creation errors do not reflect on  the standard of medical care.

## 2019-09-11 ENCOUNTER — Other Ambulatory Visit: Payer: Self-pay

## 2019-09-11 ENCOUNTER — Telehealth: Payer: Self-pay

## 2019-09-11 ENCOUNTER — Ambulatory Visit: Payer: Medicare PPO | Admitting: Rheumatology

## 2019-09-11 ENCOUNTER — Encounter: Payer: Self-pay | Admitting: Rheumatology

## 2019-09-11 VITALS — BP 174/89 | HR 109 | Resp 15 | Ht 66.0 in | Wt 214.6 lb

## 2019-09-11 DIAGNOSIS — Z79899 Other long term (current) drug therapy: Secondary | ICD-10-CM | POA: Diagnosis not present

## 2019-09-11 DIAGNOSIS — M0579 Rheumatoid arthritis with rheumatoid factor of multiple sites without organ or systems involvement: Secondary | ICD-10-CM | POA: Diagnosis not present

## 2019-09-11 DIAGNOSIS — R739 Hyperglycemia, unspecified: Secondary | ICD-10-CM | POA: Diagnosis not present

## 2019-09-11 DIAGNOSIS — Z9119 Patient's noncompliance with other medical treatment and regimen: Secondary | ICD-10-CM

## 2019-09-11 DIAGNOSIS — Z8659 Personal history of other mental and behavioral disorders: Secondary | ICD-10-CM

## 2019-09-11 DIAGNOSIS — M1712 Unilateral primary osteoarthritis, left knee: Secondary | ICD-10-CM

## 2019-09-11 DIAGNOSIS — Z91199 Patient's noncompliance with other medical treatment and regimen due to unspecified reason: Secondary | ICD-10-CM

## 2019-09-11 NOTE — Patient Instructions (Addendum)
Voltaren gel      Standing Labs We placed an order today for your standing lab work.    Please come back and get your standing labs in August and every 3 months   We have open lab daily Monday through Thursday from 8:30-12:30 PM and 1:30-4:30 PM and Friday from 8:30-12:30 PM and 1:30-4:00 PM at the office of Dr. Pollyann Savoy.   You may experience shorter wait times on Monday and Friday afternoons. The office is located at [address], [address], [place], [place] [address] No appointment is necessary.   Labs are drawn by [facility].  You may receive a bill from [facility] for your lab work.  If you wish to have your labs drawn at another location, please call the office 24 hours in advance to send orders.  If you have any questions regarding directions or hours of operation,  please call [phone].   Just as a reminder please drink plenty of water prior to coming for your lab work. Thanks!

## 2019-09-11 NOTE — Telephone Encounter (Signed)
Please refill MTX after lab results, per Sherron Ales, PA-C.

## 2019-09-12 LAB — CBC WITH DIFFERENTIAL/PLATELET
Absolute Monocytes: 288 cells/uL (ref 200–950)
Basophils Absolute: 18 cells/uL (ref 0–200)
Basophils Relative: 0.4 %
Eosinophils Absolute: 248 cells/uL (ref 15–500)
Eosinophils Relative: 5.5 %
HCT: 35.4 % (ref 35.0–45.0)
Hemoglobin: 11.5 g/dL — ABNORMAL LOW (ref 11.7–15.5)
Lymphs Abs: 2061 cells/uL (ref 850–3900)
MCH: 28.1 pg (ref 27.0–33.0)
MCHC: 32.5 g/dL (ref 32.0–36.0)
MCV: 86.6 fL (ref 80.0–100.0)
MPV: 11.5 fL (ref 7.5–12.5)
Monocytes Relative: 6.4 %
Neutro Abs: 1886 cells/uL (ref 1500–7800)
Neutrophils Relative %: 41.9 %
Platelets: 378 10*3/uL (ref 140–400)
RBC: 4.09 10*6/uL (ref 3.80–5.10)
RDW: 12.8 % (ref 11.0–15.0)
Total Lymphocyte: 45.8 %
WBC: 4.5 10*3/uL (ref 3.8–10.8)

## 2019-09-12 LAB — COMPLETE METABOLIC PANEL WITH GFR
AG Ratio: 1.4 (calc) (ref 1.0–2.5)
ALT: 25 U/L (ref 6–29)
AST: 21 U/L (ref 10–35)
Albumin: 4.4 g/dL (ref 3.6–5.1)
Alkaline phosphatase (APISO): 78 U/L (ref 37–153)
BUN: 13 mg/dL (ref 7–25)
CO2: 24 mmol/L (ref 20–32)
Calcium: 9.7 mg/dL (ref 8.6–10.4)
Chloride: 108 mmol/L (ref 98–110)
Creat: 0.83 mg/dL (ref 0.50–1.05)
GFR, Est African American: 93 mL/min/{1.73_m2} (ref >=60)
GFR, Est Non African American: 80 mL/min/{1.73_m2} (ref >=60)
Globulin: 3.2 g/dL (calc) (ref 1.9–3.7)
Glucose, Bld: 134 mg/dL — ABNORMAL HIGH (ref 65–99)
Potassium: 4.1 mmol/L (ref 3.5–5.3)
Sodium: 140 mmol/L (ref 135–146)
Total Bilirubin: 0.5 mg/dL (ref 0.2–1.2)
Total Protein: 7.6 g/dL (ref 6.1–8.1)

## 2019-09-12 NOTE — Progress Notes (Signed)
Hgb is borderline low.  Rest of CBC WNL.  Please forward labs to PCP. Glucose is elevated-134. Rest of CMP WNL.

## 2019-09-13 ENCOUNTER — Telehealth: Payer: Self-pay | Admitting: Rheumatology

## 2019-09-13 ENCOUNTER — Encounter: Payer: Self-pay | Admitting: Rheumatology

## 2019-09-13 MED ORDER — METHOTREXATE SODIUM CHEMO INJECTION 50 MG/2ML: INTRAMUSCULAR | 0 refills | Status: DC

## 2019-09-13 NOTE — Telephone Encounter (Signed)
Labs resulted Hgb is borderline low. Rest of CBC WNL.  Glucose is elevated-134. Rest of CMP WNL. Prescription sent to the pharmacy.

## 2019-09-13 NOTE — Telephone Encounter (Signed)
Patient left a voicemail requesting prescription refill of Methotrexate.   

## 2019-09-13 NOTE — Telephone Encounter (Signed)
See previous phone note.  

## 2019-10-26 ENCOUNTER — Other Ambulatory Visit: Payer: Self-pay | Admitting: Rheumatology

## 2019-10-28 ENCOUNTER — Encounter: Payer: Self-pay | Admitting: Rheumatology

## 2019-10-28 NOTE — Telephone Encounter (Signed)
Last Visit: 09/11/2019  Next Visit:02/12/2020  Current Dose per office note on 09/11/2019: folic acid 2 mg by mouth daily. DX: Rheumatoid arthritis with rheumatoid factor of multiple sites without organ or systems involvement  Okay to refill folic acid?

## 2019-12-01 ENCOUNTER — Other Ambulatory Visit: Payer: Self-pay | Admitting: Rheumatology

## 2019-12-02 NOTE — Telephone Encounter (Signed)
Last Visit: 09/11/2019  Next Visit:02/12/2020  Okay to refill per Dr. Corliss Skains

## 2019-12-04 ENCOUNTER — Other Ambulatory Visit: Payer: Self-pay | Admitting: Physician Assistant

## 2019-12-04 NOTE — Telephone Encounter (Signed)
Last Visit: 09/11/2019  Next Visit:02/12/2020 Labs: Hgb is borderline low. Rest of CBC WNL. Glucose is elevated-134. Rest of CMP WNL  Current Dose per office note on 09/11/2019: Methotrexate 0.8 ML subcutaneous every week DX: Rheumatoid arthritis with rheumatoid factor of multiple sites without organ or systems involvement  Okay to refill MTX?

## 2020-01-30 NOTE — Progress Notes (Deleted)
Office Visit Note  Patient: Debbie Williams             Date of Birth: May 14, 1965           MRN: [id number]             PCP: Porfirio Oar, PA Referring: Porfirio Oar, PA Visit Date: 02/12/2020 Occupation: @GUAROCC @  Subjective:  No chief complaint on file.   History of Present Illness: Debbie Williams is a 54 y.o. female ***   Activities of Daily Living:  Patient reports morning stiffness for *** {minute/hour:19697}.   Patient {ACTIONS;DENIES/REPORTS:21021675::"Denies"} nocturnal pain.  Difficulty dressing/grooming: {ACTIONS;DENIES/REPORTS:21021675::"Denies"} Difficulty climbing stairs: {ACTIONS;DENIES/REPORTS:21021675::"Denies"} Difficulty getting out of chair: {ACTIONS;DENIES/REPORTS:21021675::"Denies"} Difficulty using hands for taps, buttons, cutlery, and/or writing: {ACTIONS;DENIES/REPORTS:21021675::"Denies"}  No Rheumatology ROS completed.   PMFS History:  Patient Active Problem List   Diagnosis Date Noted  . High risk medication use 03/21/2016  . Anxiety 03/21/2016  . Elevated blood pressure 10/12/2014  . Allergic rhinitis due to pollen 10/12/2014  . Rheumatoid arthritis with rheumatoid factor of multiple sites without organ or systems involvement (HCC) 09/18/2014  . GAD (generalized anxiety disorder) 09/18/2014  . Left knee pain 09/18/2014  . Hyperglycemia 09/18/2014  . BMI 36.0-36.9,adult 09/18/2014    Past Medical History:  Diagnosis Date  . Anxiety   . Arthritis    Rheumatoid  . Depression   . Diabetes mellitus without complication (HCC)     Family History  Problem Relation Age of Onset  . Hypertension Mother   . Arthritis Mother   . Diabetes Maternal Grandmother   . Heart disease Maternal Grandmother   . Arthritis Maternal Grandmother    Past Surgical History:  Procedure Laterality Date  . INTRAUTERINE DEVICE INSERTION  09/2012   Mirena   Social History   Social History Narrative   Separated from her husband,  though they share a home for now.   Their daughter lives with them.   Immunization History  Administered Date(s) Administered  . Influenza,inj,Quad PF,6+ Mos 02/10/2015  . Pneumococcal Polysaccharide-23 09/18/2014  . Tdap 09/18/2014  . Zoster 01/14/2015     Objective: Vital Signs: There were no vitals taken for this visit.   Physical Exam   Musculoskeletal Exam: ***  CDAI Exam: CDAI Score: -- Patient Global: --; Provider Global: -- Swollen: --; Tender: -- Joint Exam 02/12/2020   No joint exam has been documented for this visit   There is currently no information documented on the homunculus. Go to the Rheumatology activity and complete the homunculus joint exam.  Investigation: No additional findings.  Imaging: No results found.  Recent Labs: Lab Results  Component Value Date   WBC 4.5 09/11/2019   HGB 11.5 (L) 09/11/2019   PLT 378 09/11/2019   NA 140 09/11/2019   K 4.1 09/11/2019   CL 108 09/11/2019   CO2 24 09/11/2019   GLUCOSE 134 (H) 09/11/2019   BUN 13 09/11/2019   CREATININE 0.83 09/11/2019   BILITOT 0.5 09/11/2019   ALKPHOS 56 08/18/2016   AST 21 09/11/2019   ALT 25 09/11/2019   PROT 7.6 09/11/2019   ALBUMIN 4.3 08/18/2016   CALCIUM 9.7 09/11/2019   GFRAA 93 09/11/2019    Speciality Comments: No specialty comments available.  Procedures:  No procedures performed Allergies: Grass extracts [gramineae pollens] and Pollen extract   Assessment / Plan:     Visit Diagnoses: No diagnosis found.  Orders: No orders of the defined types were placed in this encounter.  No orders of the  defined types were placed in this encounter.   Face-to-face time spent with patient was *** minutes. Greater than 50% of time was spent in counseling and coordination of care.  Follow-Up Instructions: No follow-ups on file.   Ellen Henri, CMA  Note - This record has been created using Animal nutritionist.  Chart creation errors have been sought, but may not  always  have been located. Such creation errors do not reflect on  the standard of medical care.

## 2020-02-01 ENCOUNTER — Other Ambulatory Visit: Payer: Self-pay | Admitting: Physician Assistant

## 2020-02-12 ENCOUNTER — Encounter: Payer: Self-pay | Admitting: Rheumatology

## 2020-02-12 ENCOUNTER — Ambulatory Visit: Payer: Medicare PPO | Admitting: Physician Assistant

## 2020-02-12 DIAGNOSIS — Z9119 Patient's noncompliance with other medical treatment and regimen: Secondary | ICD-10-CM

## 2020-02-12 DIAGNOSIS — Z79899 Other long term (current) drug therapy: Secondary | ICD-10-CM

## 2020-02-12 DIAGNOSIS — Z8659 Personal history of other mental and behavioral disorders: Secondary | ICD-10-CM

## 2020-02-12 DIAGNOSIS — R739 Hyperglycemia, unspecified: Secondary | ICD-10-CM

## 2020-02-12 DIAGNOSIS — M1712 Unilateral primary osteoarthritis, left knee: Secondary | ICD-10-CM

## 2020-02-12 DIAGNOSIS — M0579 Rheumatoid arthritis with rheumatoid factor of multiple sites without organ or systems involvement: Secondary | ICD-10-CM

## 2020-03-20 ENCOUNTER — Encounter: Payer: Self-pay | Admitting: Nurse Practitioner

## 2020-04-07 ENCOUNTER — Encounter: Payer: Self-pay | Admitting: Rheumatology

## 2020-04-08 ENCOUNTER — Other Ambulatory Visit: Payer: Self-pay | Admitting: *Deleted

## 2020-04-08 DIAGNOSIS — Z79899 Other long term (current) drug therapy: Secondary | ICD-10-CM

## 2020-04-09 ENCOUNTER — Other Ambulatory Visit: Payer: Self-pay | Admitting: *Deleted

## 2020-04-09 LAB — CBC WITH DIFFERENTIAL/PLATELET
Absolute Monocytes: 426 cells/uL (ref 200–950)
Basophils Absolute: 39 cells/uL (ref 0–200)
Basophils Relative: 0.7 %
Eosinophils Absolute: 190 cells/uL (ref 15–500)
Eosinophils Relative: 3.4 %
HCT: 38.2 % (ref 35.0–45.0)
Hemoglobin: 12.8 g/dL (ref 11.7–15.5)
Lymphs Abs: 2778 cells/uL (ref 850–3900)
MCH: 28.3 pg (ref 27.0–33.0)
MCHC: 33.5 g/dL (ref 32.0–36.0)
MCV: 84.5 fL (ref 80.0–100.0)
MPV: 11.4 fL (ref 7.5–12.5)
Monocytes Relative: 7.6 %
Neutro Abs: 2167 cells/uL (ref 1500–7800)
Neutrophils Relative %: 38.7 %
Platelets: 404 10*3/uL — ABNORMAL HIGH (ref 140–400)
RBC: 4.52 10*6/uL (ref 3.80–5.10)
RDW: 12.3 % (ref 11.0–15.0)
Total Lymphocyte: 49.6 %
WBC: 5.6 10*3/uL (ref 3.8–10.8)

## 2020-04-09 LAB — COMPLETE METABOLIC PANEL WITH GFR
AG Ratio: 1.3 (calc) (ref 1.0–2.5)
ALT: 30 U/L — ABNORMAL HIGH (ref 6–29)
AST: 27 U/L (ref 10–35)
Albumin: 4.6 g/dL (ref 3.6–5.1)
Alkaline phosphatase (APISO): 78 U/L (ref 37–153)
BUN: 16 mg/dL (ref 7–25)
CO2: 26 mmol/L (ref 20–32)
Calcium: 10.3 mg/dL (ref 8.6–10.4)
Chloride: 107 mmol/L (ref 98–110)
Creat: 0.83 mg/dL (ref 0.50–1.05)
GFR, Est African American: 92 mL/min/{1.73_m2} (ref >=60)
GFR, Est Non African American: 79 mL/min/{1.73_m2} (ref >=60)
Globulin: 3.6 g/dL (calc) (ref 1.9–3.7)
Glucose, Bld: 92 mg/dL (ref 65–99)
Potassium: 4.6 mmol/L (ref 3.5–5.3)
Sodium: 141 mmol/L (ref 135–146)
Total Bilirubin: 0.6 mg/dL (ref 0.2–1.2)
Total Protein: 8.2 g/dL — ABNORMAL HIGH (ref 6.1–8.1)

## 2020-04-09 MED ORDER — METHOTREXATE SODIUM CHEMO INJECTION 50 MG/2ML
20.0000 mg | INTRAMUSCULAR | 0 refills | Status: DC
Start: 2020-04-09 — End: 2020-07-01

## 2020-04-09 NOTE — Telephone Encounter (Signed)
Last Visit:09/11/2019 Next Visit: 05/07/2020 Labs: 04/08/2020 total protein 8.2, ALT 30, Platelets 404  Current Dose per office noteon 09/11/2019:Methotrexate 0.8 ML subcutaneous every week [id number] arthritis with rheumatoid factor of multiple sites without organ or systems involvement  Okay to refill MTX?

## 2020-04-09 NOTE — Progress Notes (Signed)
Patient is a mildly elevated.  We will continue to monitor.  ALT is mildly elevated.  No change in treatment advised.  We will repeat labs in 3 months.

## 2020-04-23 NOTE — Progress Notes (Deleted)
Office Visit Note  Patient: Debbie Williams             Date of Birth: 1966/01/30           MRN: [id number]             PCP: Porfirio Oar, PA Referring: Porfirio Oar, PA Visit Date: 05/07/2020 Occupation: @GUAROCC @  Subjective:  No chief complaint on file.   History of Present Illness: Debbie Williams is a 55 y.o. female ***   Activities of Daily Living:  Patient reports morning stiffness for *** {minute/hour:19697}.   Patient {ACTIONS;DENIES/REPORTS:21021675::"Denies"} nocturnal pain.  Difficulty dressing/grooming: {ACTIONS;DENIES/REPORTS:21021675::"Denies"} Difficulty climbing stairs: {ACTIONS;DENIES/REPORTS:21021675::"Denies"} Difficulty getting out of chair: {ACTIONS;DENIES/REPORTS:21021675::"Denies"} Difficulty using hands for taps, buttons, cutlery, and/or writing: {ACTIONS;DENIES/REPORTS:21021675::"Denies"}  No Rheumatology ROS completed.   PMFS History:  Patient Active Problem List   Diagnosis Date Noted  . High risk medication use 03/21/2016  . Anxiety 03/21/2016  . Elevated blood pressure 10/12/2014  . Allergic rhinitis due to pollen 10/12/2014  . Rheumatoid arthritis with rheumatoid factor of multiple sites without organ or systems involvement (HCC) 09/18/2014  . GAD (generalized anxiety disorder) 09/18/2014  . Left knee pain 09/18/2014  . Hyperglycemia 09/18/2014  . BMI 36.0-36.9,adult 09/18/2014    Past Medical History:  Diagnosis Date  . Anxiety   . Arthritis    Rheumatoid  . Depression   . Diabetes mellitus without complication ([facility])     Family History  Problem Relation Age of Onset  . Hypertension Mother   . Arthritis Mother   . Diabetes Maternal Grandmother   . Heart disease Maternal Grandmother   . Arthritis Maternal Grandmother    Past Surgical History:  Procedure Laterality Date  . INTRAUTERINE DEVICE INSERTION  09/2012   Mirena   Social History   Social History Narrative   Separated from her husband,  though they share a home for now.   Their daughter lives with them.   Immunization History  Administered Date(s) Administered  . Influenza,inj,Quad PF,6+ Mos 02/10/2015  . Pneumococcal Polysaccharide-23 09/18/2014  . Tdap 09/18/2014  . Zoster 01/14/2015     Objective: Vital Signs: There were no vitals taken for this visit.   Physical Exam   Musculoskeletal Exam: ***  CDAI Exam: CDAI Score: -- Patient Global: --; Provider Global: -- Swollen: --; Tender: -- Joint Exam 05/07/2020   No joint exam has been documented for this visit   There is currently no information documented on the homunculus. Go to the Rheumatology activity and complete the homunculus joint exam.  Investigation: No additional findings.  Imaging: No results found.  Recent Labs: Lab Results  Component Value Date   WBC 5.6 04/08/2020   HGB 12.8 04/08/2020   PLT 404 (H) 04/08/2020   NA 141 04/08/2020   K 4.6 04/08/2020   CL 107 04/08/2020   CO2 26 04/08/2020   GLUCOSE 92 04/08/2020   BUN 16 04/08/2020   CREATININE 0.83 04/08/2020   BILITOT 0.6 04/08/2020   ALKPHOS 56 08/18/2016   AST 27 04/08/2020   ALT 30 (H) 04/08/2020   PROT 8.2 (H) 04/08/2020   ALBUMIN 4.3 08/18/2016   CALCIUM 10.3 04/08/2020   GFRAA 92 04/08/2020    Speciality Comments: No specialty comments available.  Procedures:  No procedures performed Allergies: Grass extracts [gramineae pollens] and Pollen extract   Assessment / Plan:     Visit Diagnoses: Rheumatoid arthritis with rheumatoid factor of multiple sites without organ or systems involvement ([facility])  High risk medication use  Primary osteoarthritis and rheumatoid arthritis of left knee  Chronic pain of left knee  History of anxiety  Elevated blood pressure reading  Orders: No orders of the defined types were placed in this encounter.  No orders of the defined types were placed in this encounter.   Face-to-face time spent with patient was *** minutes.  Greater than 50% of time was spent in counseling and coordination of care.  Follow-Up Instructions: No follow-ups on file.   Gearldine Bienenstock, PA-C  Note - This record has been created using Dragon software.  Chart creation errors have been sought, but may not always  have been located. Such creation errors do not reflect on  the standard of medical care.

## 2020-05-06 NOTE — Progress Notes (Signed)
Office Visit Note  Patient: Debbie Williams             Date of Birth: 1965-08-09           MRN: [id number]             PCP: Harrison Mons, PA Referring: Harrison Mons, [place] Visit Date: 05/18/2020 Occupation: '@GUAROCC' @  Subjective:  Left knee joint pain   History of Present Illness: Debbie Williams is a 55 y.o. female with history of seropositive rheumatoid arthritis and osteoarthritis.  She is on methotrexate 0.8 ml sq injections once weekly and folic acid 2 mg po daily.  She is tolerating MTX without any side effects. She states she held MTX for several weeks around the time of receiving the moderna booster on 04/04/20. She states she noticed significant clinical improvement after receiving the series of 3 Orthovisc in March/April 2021.  she states she was able to be more active and was exercising on a regular basis without difficulty.  She states for the past 1 month her discomfort has gradually started to improve.  She has tried using voltaren gel, which was not helpful at alleviating her discomfort.  According to the patient she was in [place] on 04/24/20 and was walking prolonged distances, which she feels has also exacerbated her discomfort.  She has been taking aleve as needed for pain relief.  She attributes her slight elevation in LFTs due to aleve and occasional alcohol [place].  She states she rarely drinks alcohol.  She would like to come back in about 2 weeks to recheck LFTs.  She would also like to reapply for left knee visco gel injections.      Activities of Daily Living:  Patient reports morning stiffness for 20 minutes.   Patient Denies nocturnal pain.  Difficulty dressing/grooming: Denies Difficulty climbing stairs: Reports Difficulty getting out of chair: Denies Difficulty using hands for taps, buttons, cutlery, and/or writing: Reports  Review of Systems  Constitutional: Positive for fatigue.  HENT: Negative for mouth dryness.   Eyes: Negative for  dryness.  Respiratory: Negative for shortness of breath.   Cardiovascular: Negative for swelling in legs/feet.  Gastrointestinal: Negative for constipation.  Endocrine: Negative for excessive thirst.  Genitourinary: Negative for difficulty urinating.  Musculoskeletal: Positive for arthralgias, joint pain, joint swelling and morning stiffness.  Skin: Negative for rash.  Allergic/Immunologic: Negative for susceptible to infections.  Neurological: Negative for weakness.  Hematological: Negative for bruising/bleeding tendency.  Psychiatric/Behavioral: Negative for sleep disturbance.    PMFS History:  Patient Active Problem List   Diagnosis Date Noted  . High risk medication use 03/21/2016  . Anxiety 03/21/2016  . Elevated blood pressure 10/12/2014  . Allergic rhinitis due to pollen 10/12/2014  . Rheumatoid arthritis with rheumatoid factor of multiple sites without organ or systems involvement ([place]) 09/18/2014  . GAD (generalized anxiety disorder) 09/18/2014  . Left knee pain 09/18/2014  . Hyperglycemia 09/18/2014  . BMI 36.0-36.9,adult 09/18/2014    Past Medical History:  Diagnosis Date  . Anxiety   . Arthritis    Rheumatoid  . Depression   . Diabetes mellitus without complication ([place])     Family History  Problem Relation Age of Onset  . Hypertension Mother   . Arthritis Mother   . Diabetes Maternal Grandmother   . Heart disease Maternal Grandmother   . Arthritis Maternal Grandmother    Past Surgical History:  Procedure Laterality Date  . INTRAUTERINE DEVICE INSERTION  09/2012   Mirena   Social History  Social History Narrative   Separated from her husband, though they share a home for now.   Their daughter lives with them.   Immunization History  Administered Date(s) Administered  . Influenza,inj,Quad PF,6+ Mos 02/10/2015  . Moderna Sars-Covid-2 Vaccination 04/04/2020  . PFIZER Comirnaty(Gray Top)Covid-19 Tri-Sucrose Vaccine 06/26/2019, 07/24/2019  .  Pneumococcal Polysaccharide-23 09/18/2014  . Tdap 09/18/2014  . Zoster 01/14/2015     Objective: Vital Signs: BP (!) 175/81 (BP Location: Left Arm, Patient Position: Sitting, Cuff Size: Small)   Pulse (!) 105   Resp 12   Ht '5\' 6"'  (1.676 m)   Wt 217 lb (98.4 kg)   LMP 09/01/2011   BMI 35.02 kg/m    Physical Exam Vitals and nursing note reviewed.  Constitutional:      Appearance: She is well-developed and well-nourished.  HENT:     Head: Normocephalic and atraumatic.  Eyes:     Extraocular Movements: EOM normal.     Conjunctiva/sclera: Conjunctivae normal.  Cardiovascular:     Pulses: Intact distal pulses.  Pulmonary:     Effort: Pulmonary effort is normal.  Abdominal:     Palpations: Abdomen is soft.  Musculoskeletal:     Cervical back: Normal range of motion.  Skin:    General: Skin is warm and dry.     Capillary Refill: Capillary refill takes less than 2 seconds.  Neurological:     Mental Status: She is alert and oriented to person, place, and time.  Psychiatric:        Mood and Affect: Mood and affect normal.        Behavior: Behavior normal.      Musculoskeletal Exam: C-spine, thoracic spine, and lumbar spine good ROM.  Shoulder joints and elbow joints good ROM with no discomfort.  Limited ROM and synovial thickening of both wrist joints.  No tenderness or synovitis of MCPs joints.  Complete fist formation bilaterally.  Hip joints good ROM with no discomfort.  Right knee has good ROM  With no discomfort.  Left knee joint crepitus and warmth noted on exam.   CDAI Exam: CDAI Score: 2.7  Patient Global: 5 mm; Provider Global: 2 mm Swollen: 1 ; Tender: 1  Joint Exam 05/18/2020      Right  Left  Knee     Swollen Tender     Investigation: No additional findings.  Imaging: No results found.  Recent Labs: Lab Results  Component Value Date   WBC 5.6 04/08/2020   HGB 12.8 04/08/2020   PLT 404 (H) 04/08/2020   NA 141 04/08/2020   K 4.6 04/08/2020   CL 107  04/08/2020   CO2 26 04/08/2020   GLUCOSE 92 04/08/2020   BUN 16 04/08/2020   CREATININE 0.83 04/08/2020   BILITOT 0.6 04/08/2020   ALKPHOS 56 08/18/2016   AST 27 04/08/2020   ALT 30 (H) 04/08/2020   PROT 8.2 (H) 04/08/2020   ALBUMIN 4.3 08/18/2016   CALCIUM 10.3 04/08/2020   GFRAA 92 04/08/2020    Speciality Comments: No specialty comments available.  Procedures:  No procedures performed Allergies: Grass extracts [gramineae pollens] and Pollen extract    Assessment / Plan:     Visit Diagnoses: Rheumatoid arthritis with rheumatoid factor of multiple sites without organ or systems involvement (HCC) - Positive RF, positive anti-CCP, elevated ESR, erosive disease: She presents today with increased pain and warmth in the left knee joint, which started about 1 month ago after walking prolonged distances while in [place].  She has  painful ROM with crepitus and warmth but no effusion of the right knee joint on exam today. She noticed significant clinical improvement in the discomfort and stiffness in her left knee joint after receiving the orthovisc series in March/April 2021.  We will reapply for visco gel injections for the left knee.  She is not a ideal candidates for cortisone injections due to history of hyperglycemia.  She was also advised to avoid tylenol and NSAIDs while on MTX due to slight elevation in ALT on 04/08/20.  She is not experiencing any other joint pain or joint swelling at this time. She has occasional flares in her wrist joints after performing overuse activities.  She has overall clinically been doing well on Methotrexate 0.8 ml sq injections once weekly and folic acid 2 mg po daily. She had a short gap in therapy around the time of receiving the moderna booster vaccine on 04/04/20, but she has not had any other gaps in therapy.  She does not want to make any medication changes at this time. She will continue on methotrexate 0.8 ml sq injections once weekly and folic acid 2  mg po daily. She does not need any refills at this time.  She was advised to notify us if she develops increased joint pain or joint swelling.  We will notify her once the visco injections have been approved by her insurance. She will follow up in 5 months for her routine follow up visit.   High risk medication use - Methotrexate 0.8 ML subcutaneous injections every week and folic acid 2 mg by mouth daily.  CBC and CMP updated on 04/08/20.  ALT 30 on 04/08/20.  According to the patient she has been taking aleve as needed for pain relief for the past 1 month due to increased discomfort in her left knee joint.  Discussed the importance of avoiding tylenol, NSAIDs, and alcohol use.  She would like to return in 2 weeks instead of updating lab work today.  Future order for hepatic function panel placed today.- Plan: Hepatic function panel She has not had any recent infections.  She was advised to hold MTX if she develops signs or symptoms of an infection and to resume once the infection has completely cleared. She voiced understanding. She has received 2 pfizer covid-19 vaccine doses and one moderna vaccine dose.   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  S/p orthovisc left knee, 07/08/2019, 07/15/2019, 07/22/2019.  She presents today with increased discomfort in her left knee joint, which started about 1 month ago after walking prolonged distances while visiting [place].  She previously noticed significant clinical improvement in her joint pain and stiffness after undergoing orthovisc injections in March/April 2021. She was able to start exercising more with less difficulty.  She has tried using voltaren gel topically but not minimal relief.  She has been taking aleve as needed for pain relief.  She was advised to avoid the use of tylenol, NSAIDs, and alcohol use due to recent elevation in  Alt on 04/08/20.  On exam today she has painful ROM with crepitus and warmth but no effusion.  She would like to reapply for visco gel injections for the left knee joint.    Hyperglycemia: Glucose was 92 on 04/08/20. She has been monitoring her blood sugar closely. She is not a good candidate for cortisone injections or prednisone tapers at this time.   History  of anxiety: She takes zoloft as prescribed.   Noncompliance - Patient did not take any DMARDs for many years and developed more aggressive and severe disease. Apprehensive to make any medication changes at this time.    Elevated LFTs -ALT is borderline-30. Future order for hepatic function panel placed today.  Advised to avoid tylenol, NSAIDs, and alcohol use.  She will return in 2 weeks to check hepatic function panel. Plan: Hepatic function panel  Orders: Orders Placed This Encounter  Procedures  . Hepatic function panel   No orders of the defined types were placed in this encounter.     Follow-Up Instructions: Return in about 5 months (around 10/15/2020) for Rheumatoid arthritis, Osteoarthritis.   Ofilia Neas, PA-C  Note - This record has been created using Dragon software.  Chart creation errors have been sought, but may not always  have been located. Such creation errors do not reflect on  the standard of medical care.

## 2020-05-07 ENCOUNTER — Ambulatory Visit: Payer: Medicare PPO | Admitting: Physician Assistant

## 2020-05-07 DIAGNOSIS — Z8659 Personal history of other mental and behavioral disorders: Secondary | ICD-10-CM

## 2020-05-07 DIAGNOSIS — R030 Elevated blood-pressure reading, without diagnosis of hypertension: Secondary | ICD-10-CM

## 2020-05-07 DIAGNOSIS — M1712 Unilateral primary osteoarthritis, left knee: Secondary | ICD-10-CM

## 2020-05-07 DIAGNOSIS — G8929 Other chronic pain: Secondary | ICD-10-CM

## 2020-05-07 DIAGNOSIS — M0579 Rheumatoid arthritis with rheumatoid factor of multiple sites without organ or systems involvement: Secondary | ICD-10-CM

## 2020-05-07 DIAGNOSIS — Z79899 Other long term (current) drug therapy: Secondary | ICD-10-CM

## 2020-05-18 ENCOUNTER — Other Ambulatory Visit: Payer: Self-pay

## 2020-05-18 ENCOUNTER — Telehealth: Payer: Self-pay

## 2020-05-18 ENCOUNTER — Ambulatory Visit (INDEPENDENT_AMBULATORY_CARE_PROVIDER_SITE_OTHER): Payer: Medicare PPO | Admitting: Physician Assistant

## 2020-05-18 ENCOUNTER — Encounter: Payer: Self-pay | Admitting: Physician Assistant

## 2020-05-18 VITALS — BP 175/81 | HR 105 | Resp 12 | Ht 66.0 in | Wt 217.0 lb

## 2020-05-18 DIAGNOSIS — M0579 Rheumatoid arthritis with rheumatoid factor of multiple sites without organ or systems involvement: Secondary | ICD-10-CM

## 2020-05-18 DIAGNOSIS — Z79899 Other long term (current) drug therapy: Secondary | ICD-10-CM

## 2020-05-18 DIAGNOSIS — R739 Hyperglycemia, unspecified: Secondary | ICD-10-CM | POA: Diagnosis not present

## 2020-05-18 DIAGNOSIS — Z91199 Patient's noncompliance with other medical treatment and regimen due to unspecified reason: Secondary | ICD-10-CM

## 2020-05-18 DIAGNOSIS — Z9119 Patient's noncompliance with other medical treatment and regimen: Secondary | ICD-10-CM

## 2020-05-18 DIAGNOSIS — Z8659 Personal history of other mental and behavioral disorders: Secondary | ICD-10-CM

## 2020-05-18 DIAGNOSIS — R7989 Other specified abnormal findings of blood chemistry: Secondary | ICD-10-CM

## 2020-05-18 DIAGNOSIS — M1712 Unilateral primary osteoarthritis, left knee: Secondary | ICD-10-CM | POA: Diagnosis not present

## 2020-05-18 NOTE — Patient Instructions (Signed)
Standing Labs We placed an order today for your standing lab work.   Please have your standing labs drawn in 2 weeks then every 3 months   If possible, please have your labs drawn 2 weeks prior to your appointment so that the provider can discuss your results at your appointment.  We have open lab daily Monday through Thursday from 8:30-12:30 PM and 1:30-4:30 PM and Friday from 8:30-12:30 PM and 1:30-4:00 PM at the office of Dr. Pollyann Savoy, [facility].   Please be advised, all patients with office appointments requiring lab work will take precedents over walk-in lab work.  If possible, please come for your lab work on Monday and Friday afternoons, as you may experience shorter wait times. The office is located at [address], [place], [place] [address] No appointment is necessary.   Labs are drawn by [facility]. Please bring your co-pay at the time of your lab draw.  You may receive a bill from [facility] for your lab work.  If you wish to have your labs drawn at another location, please call the office 24 hours in advance to send orders.  If you have any questions regarding directions or hours of operation,  please call [phone].   As a reminder, please drink plenty of water prior to coming for your lab work. Thanks!

## 2020-05-18 NOTE — Telephone Encounter (Signed)
Please apply for left knee visco, per Taylor Dale, PA-C. Thanks!  

## 2020-05-19 NOTE — Telephone Encounter (Signed)
Submitted for VOB, and PA for Orthovisc thru [employer]. 05/19/2020.

## 2020-05-21 NOTE — Telephone Encounter (Signed)
Please call to schedule Visco knee injections.  Authorized for Orthovisc series left knee. Buy and [employer] Deductible does not apply. PA for Orthovisc thru [employer]. Auth # [id number] effective 05/19/2020- 04/16/2021  Patient to pay $40.00 copay each visit. Once OOP is met, insurance to cover 100%. (as of date $0 of $4000. Has been met)

## 2020-06-08 ENCOUNTER — Ambulatory Visit: Payer: Medicare PPO | Admitting: Physician Assistant

## 2020-06-08 ENCOUNTER — Other Ambulatory Visit: Payer: Self-pay

## 2020-06-08 DIAGNOSIS — M1712 Unilateral primary osteoarthritis, left knee: Secondary | ICD-10-CM

## 2020-06-08 MED ORDER — HYALURONAN 30 MG/2ML IX SOSY
30.0000 mg | PREFILLED_SYRINGE | INTRA_ARTICULAR | Status: AC | PRN
  Administered 2020-06-08: 30 mg via INTRA_ARTICULAR

## 2020-06-08 MED ORDER — LIDOCAINE HCL 1 % IJ SOLN
1.5000 mL | INTRAMUSCULAR | Status: AC | PRN
  Administered 2020-06-08: 1.5 mL

## 2020-06-08 NOTE — Progress Notes (Signed)
   Procedure Note  Patient: Debbie Williams             Date of Birth: Nov 11, 1965           MRN: [id number]             Visit Date: 06/08/2020  Procedures: Visit Diagnoses:  1. Primary osteoarthritis and rheumatoid arthritis of left knee      Orthovisc #1 left knee, B/B  Large Joint Inj: L knee on 06/08/2020 9:55 AM Indications: pain Details: 25 G 1.5 in needle, medial approach  Arthrogram: No  Medications: 1.5 mL lidocaine 1 %; 30 mg Hyaluronan 30 MG/2ML Aspirate: 0 mL Outcome: tolerated well, no immediate complications Procedure, treatment alternatives, risks and benefits explained, specific risks discussed. Consent was given by the patient. Immediately prior to procedure a time out was called to verify the correct patient, procedure, equipment, support staff and site/side marked as required. Patient was prepped and draped in the usual sterile fashion.      Patient tolerated the procedure well.  Aftercare was discussed.  Sherron Ales, PA-C

## 2020-06-15 ENCOUNTER — Ambulatory Visit: Payer: Medicare PPO | Admitting: Physician Assistant

## 2020-06-15 ENCOUNTER — Other Ambulatory Visit: Payer: Self-pay

## 2020-06-15 DIAGNOSIS — M1712 Unilateral primary osteoarthritis, left knee: Secondary | ICD-10-CM | POA: Diagnosis not present

## 2020-06-15 MED ORDER — HYALURONAN 30 MG/2ML IX SOSY
30.0000 mg | PREFILLED_SYRINGE | INTRA_ARTICULAR | Status: AC | PRN
  Administered 2020-06-15: 30 mg via INTRA_ARTICULAR

## 2020-06-15 MED ORDER — LIDOCAINE HCL 1 % IJ SOLN
1.5000 mL | INTRAMUSCULAR | Status: AC | PRN
  Administered 2020-06-15: 1.5 mL

## 2020-06-15 NOTE — Progress Notes (Signed)
   Procedure Note  Patient: Debbie Williams             Date of Birth: 12-Feb-1966           MRN: [id number]             Visit Date: 06/15/2020  Procedures: Visit Diagnoses:  1. Primary osteoarthritis and rheumatoid arthritis of left knee      Orthovisc #2 left knee, B/B  Large Joint Inj: L knee on 06/15/2020 8:31 AM Indications: pain Details: 25 G 1.5 in needle, medial approach  Arthrogram: No  Medications: 1.5 mL lidocaine 1 %; 30 mg Hyaluronan 30 MG/2ML Aspirate: 0 mL Outcome: tolerated well, no immediate complications Procedure, treatment alternatives, risks and benefits explained, specific risks discussed. Consent was given by the patient. Immediately prior to procedure a time out was called to verify the correct patient, procedure, equipment, support staff and site/side marked as required. Patient was prepped and draped in the usual sterile fashion.     Patient tolerated the procedure well.  Aftercare was discussed.  Sherron Ales, PA-C

## 2020-06-22 ENCOUNTER — Other Ambulatory Visit: Payer: Self-pay

## 2020-06-22 ENCOUNTER — Ambulatory Visit: Payer: Medicare PPO | Admitting: Physician Assistant

## 2020-06-22 DIAGNOSIS — M1712 Unilateral primary osteoarthritis, left knee: Secondary | ICD-10-CM

## 2020-06-22 MED ORDER — LIDOCAINE HCL 1 % IJ SOLN
1.5000 mL | INTRAMUSCULAR | Status: AC | PRN
  Administered 2020-06-22: 1.5 mL

## 2020-06-22 MED ORDER — HYALURONAN 30 MG/2ML IX SOSY
30.0000 mg | PREFILLED_SYRINGE | INTRA_ARTICULAR | Status: AC | PRN
  Administered 2020-06-22: 30 mg via INTRA_ARTICULAR

## 2020-06-22 NOTE — Progress Notes (Signed)
   Procedure Note  Patient: Debbie Williams             Date of Birth: [birth year]           MRN: [id number]             Visit Date: 06/22/2020  Procedures: Visit Diagnoses:  1. Primary osteoarthritis and rheumatoid arthritis of left knee      Orthovisc #3 left knee, B/B  Large Joint Inj: L knee on 06/22/2020 11:31 AM Indications: pain Details: 25 G 1.5 in needle, medial approach  Arthrogram: No  Medications: 1.5 mL lidocaine 1 %; 30 mg Hyaluronan 30 MG/2ML Aspirate: 0 mL Outcome: tolerated well, no immediate complications Procedure, treatment alternatives, risks and benefits explained, specific risks discussed. Consent was given by the patient. Immediately prior to procedure a time out was called to verify the correct patient, procedure, equipment, support staff and site/side marked as required. Patient was prepped and draped in the usual sterile fashion.     Patient tolerated the procedure well.  Aftercare was discussed.  Sherron Ales, PA-C

## 2020-07-01 ENCOUNTER — Other Ambulatory Visit: Payer: Self-pay | Admitting: Rheumatology

## 2020-07-01 NOTE — Telephone Encounter (Signed)
Next Visit: 09/30/2020  Last Visit: 05/18/2020  Last Fill: 04/09/2020  DX: Rheumatoid arthritis with rheumatoid factor of multiple sites without organ or systems involvement   Current Dose per office note 05/18/2020, Methotrexate 0.8 ML subcutaneous injections every week  Labs: 04/08/2020  Okay to refill MTX?

## 2020-07-31 ENCOUNTER — Other Ambulatory Visit: Payer: Self-pay | Admitting: Physician Assistant

## 2020-08-03 NOTE — Telephone Encounter (Signed)
Next Visit: 09/30/2020  Last Visit: 05/18/2020  Last Fill: 10/28/2019  DX: Rheumatoid arthritis with rheumatoid factor of multiple sites without organ or systems involvement   Current Dose per office note on 05/18/2020: folic acid 2 mg by mouth daily.   Okay to refill folic acid?

## 2020-08-10 ENCOUNTER — Encounter: Payer: Self-pay | Admitting: Rheumatology

## 2020-09-16 NOTE — Progress Notes (Deleted)
Office Visit Note  Patient: Debbie Williams             Date of Birth: 02-20-1966           MRN: [id number]             PCP: Porfirio Oar, PA Referring: Porfirio Oar, PA Visit Date: 09/30/2020 Occupation: @GUAROCC @  Subjective:  No chief complaint on file.   History of Present Illness: Debbie Williams is a 55 y.o. female ***   Activities of Daily Living:  Patient reports morning stiffness for *** {minute/hour:19697}.   Patient {ACTIONS;DENIES/REPORTS:21021675::"Denies"} nocturnal pain.  Difficulty dressing/grooming: {ACTIONS;DENIES/REPORTS:21021675::"Denies"} Difficulty climbing stairs: {ACTIONS;DENIES/REPORTS:21021675::"Denies"} Difficulty getting out of chair: {ACTIONS;DENIES/REPORTS:21021675::"Denies"} Difficulty using hands for taps, buttons, cutlery, and/or writing: {ACTIONS;DENIES/REPORTS:21021675::"Denies"}  No Rheumatology ROS completed.   PMFS History:  Patient Active Problem List   Diagnosis Date Noted  . High risk medication use 03/21/2016  . Anxiety 03/21/2016  . Elevated blood pressure 10/12/2014  . Allergic rhinitis due to pollen 10/12/2014  . Rheumatoid arthritis with rheumatoid factor of multiple sites without organ or systems involvement (HCC) 09/18/2014  . GAD (generalized anxiety disorder) 09/18/2014  . Left knee pain 09/18/2014  . Hyperglycemia 09/18/2014  . BMI 36.0-36.9,adult 09/18/2014    Past Medical History:  Diagnosis Date  . Anxiety   . Arthritis    Rheumatoid  . Depression   . Diabetes mellitus without complication (HCC)     Family History  Problem Relation Age of Onset  . Hypertension Mother   . Arthritis Mother   . Diabetes Maternal Grandmother   . Heart disease Maternal Grandmother   . Arthritis Maternal Grandmother    Past Surgical History:  Procedure Laterality Date  . INTRAUTERINE DEVICE INSERTION  09/2012   Mirena   Social History   Social History Narrative   Separated from her husband,  though they share a home for now.   Their daughter lives with them.   Immunization History  Administered Date(s) Administered  . Influenza,inj,Quad PF,6+ Mos 02/10/2015  . Moderna Sars-Covid-2 Vaccination 04/04/2020  . PFIZER Comirnaty(Gray Top)Covid-19 Tri-Sucrose Vaccine 06/26/2019, 07/24/2019  . Pneumococcal Polysaccharide-23 09/18/2014  . Tdap 09/18/2014  . Zoster, Live 01/14/2015     Objective: Vital Signs: LMP 09/01/2011    Physical Exam   Musculoskeletal Exam: ***  CDAI Exam: CDAI Score: -- Patient Global: --; Provider Global: -- Swollen: --; Tender: -- Joint Exam 09/30/2020   No joint exam has been documented for this visit   There is currently no information documented on the homunculus. Go to the Rheumatology activity and complete the homunculus joint exam.  Investigation: No additional findings.  Imaging: No results found.  Recent Labs: Lab Results  Component Value Date   WBC 5.6 04/08/2020   HGB 12.8 04/08/2020   PLT 404 (H) 04/08/2020   NA 141 04/08/2020   K 4.6 04/08/2020   CL 107 04/08/2020   CO2 26 04/08/2020   GLUCOSE 92 04/08/2020   BUN 16 04/08/2020   CREATININE 0.83 04/08/2020   BILITOT 0.6 04/08/2020   ALKPHOS 56 08/18/2016   AST 27 04/08/2020   ALT 30 (H) 04/08/2020   PROT 8.2 (H) 04/08/2020   ALBUMIN 4.3 08/18/2016   CALCIUM 10.3 04/08/2020   GFRAA 92 04/08/2020    Speciality Comments: No specialty comments available.  Procedures:  No procedures performed Allergies: Grass extracts [gramineae pollens] and Pollen extract   Assessment / Plan:     Visit Diagnoses: No diagnosis found.  Orders: No orders of the  defined types were placed in this encounter.  No orders of the defined types were placed in this encounter.   Face-to-face time spent with patient was *** minutes. Greater than 50% of time was spent in counseling and coordination of care.  Follow-Up Instructions: No follow-ups on file.   Ellen Henri,  CMA  Note - This record has been created using Animal nutritionist.  Chart creation errors have been sought, but may not always  have been located. Such creation errors do not reflect on  the standard of medical care.

## 2020-09-29 ENCOUNTER — Other Ambulatory Visit: Payer: Self-pay | Admitting: Physician Assistant

## 2020-09-30 ENCOUNTER — Ambulatory Visit: Payer: Medicare PPO | Admitting: Rheumatology

## 2020-09-30 DIAGNOSIS — M0579 Rheumatoid arthritis with rheumatoid factor of multiple sites without organ or systems involvement: Secondary | ICD-10-CM

## 2020-09-30 DIAGNOSIS — M1712 Unilateral primary osteoarthritis, left knee: Secondary | ICD-10-CM

## 2020-09-30 DIAGNOSIS — Z9119 Patient's noncompliance with other medical treatment and regimen: Secondary | ICD-10-CM

## 2020-09-30 DIAGNOSIS — R739 Hyperglycemia, unspecified: Secondary | ICD-10-CM

## 2020-09-30 DIAGNOSIS — R7989 Other specified abnormal findings of blood chemistry: Secondary | ICD-10-CM

## 2020-09-30 DIAGNOSIS — Z8659 Personal history of other mental and behavioral disorders: Secondary | ICD-10-CM

## 2020-09-30 DIAGNOSIS — Z79899 Other long term (current) drug therapy: Secondary | ICD-10-CM

## 2020-11-09 ENCOUNTER — Other Ambulatory Visit: Payer: Self-pay | Admitting: *Deleted

## 2020-11-09 ENCOUNTER — Telehealth: Payer: Self-pay

## 2020-11-09 NOTE — Telephone Encounter (Signed)
Patient called requesting prescription refill of Folic Acid to be sent to [facility] at [address]

## 2020-11-09 NOTE — Telephone Encounter (Signed)
error 

## 2020-11-09 NOTE — Telephone Encounter (Signed)
I called patient, RX for folic acid sent in April, 2022 for 1 year supply, [facility], [address]

## 2020-11-27 NOTE — Progress Notes (Signed)
Office Visit Note  Patient: Debbie Williams             Date of Birth: 16-Jan-1966           MRN: [id number]             PCP: Harrison Mons, [place] Referring: Harrison Mons, [place] Visit Date: 12/11/2020 Occupation: '@GUAROCC' @  Subjective:  Medication monitoring.   History of Present Illness: Debbie Williams is a 55 y.o. female with history of seropositive rheumatoid arthritis.  She states that arthritis is quite well controlled with methotrexate.  She has limited range of motion of her wrist joints but no swelling.  She has not had swelling in a while.  She has off-and-on discomfort in the left knee joint for which she gets discuss abdomen injections.  Activities of Daily Living:  Patient reports morning stiffness for 5 minutes.   Patient Denies nocturnal pain.  Difficulty dressing/grooming: Denies Difficulty climbing stairs: Denies Difficulty getting out of chair: Denies Difficulty using hands for taps, buttons, cutlery, and/or writing: Reports  Review of Systems  Constitutional:  Positive for fatigue.  HENT:  Negative for mouth sores, mouth dryness and nose dryness.   Eyes:  Negative for pain, itching and dryness.  Respiratory:  Negative for shortness of breath and difficulty breathing.   Cardiovascular:  Negative for chest pain and palpitations.  Gastrointestinal:  Negative for blood in stool, constipation and diarrhea.  Endocrine: Negative for increased urination.  Genitourinary:  Negative for difficulty urinating.  Musculoskeletal:  Positive for joint pain, joint pain and morning stiffness. Negative for joint swelling, myalgias, muscle tenderness and myalgias.  Skin:  Negative for color change, rash and redness.  Allergic/Immunologic: Negative for susceptible to infections.  Neurological:  Negative for dizziness, numbness, headaches, memory loss and weakness.  Hematological:  Negative for bruising/bleeding tendency.  Psychiatric/Behavioral:  Negative for  confusion.    PMFS History:  Patient Active Problem List   Diagnosis Date Noted   High risk medication use 03/21/2016   Anxiety 03/21/2016   Elevated blood pressure 10/12/2014   Allergic rhinitis due to pollen 10/12/2014   Rheumatoid arthritis with rheumatoid factor of multiple sites without organ or systems involvement ([place]) 09/18/2014   GAD (generalized anxiety disorder) 09/18/2014   Left knee pain 09/18/2014   Hyperglycemia 09/18/2014   BMI 36.0-36.9,adult 09/18/2014    Past Medical History:  Diagnosis Date   Anxiety    Arthritis    Rheumatoid   Depression    Diabetes mellitus without complication ([place])     Family History  Problem Relation Age of Onset   Hypertension Mother    Arthritis Mother    Diabetes Maternal Grandmother    Heart disease Maternal Grandmother    Arthritis Maternal Grandmother    Past Surgical History:  Procedure Laterality Date   INTRAUTERINE DEVICE INSERTION  09/2012   Mirena   Social History   Social History Narrative   Separated from her husband, though they share a home for now.   Their daughter lives with them.   Immunization History  Administered Date(s) Administered   Influenza,inj,Quad PF,6+ Mos 02/10/2015   Moderna Sars-Covid-2 Vaccination 04/04/2020   PFIZER Comirnaty(Gray Top)Covid-19 Tri-Sucrose Vaccine 06/26/2019, 07/24/2019   Pneumococcal Polysaccharide-23 09/18/2014   Tdap 09/18/2014   Zoster, Live 01/14/2015     Objective: Vital Signs: BP (!) 184/74 (BP Location: Right Arm, Patient Position: Sitting, Cuff Size: Large)   Pulse (!) 101   Ht '5\' 6"'  (1.676 m)   Wt 219 lb (99.3  kg)   LMP 09/01/2011   BMI 35.35 kg/m    Physical Exam Vitals and nursing note reviewed.  Constitutional:      Appearance: She is well-developed.  HENT:     Head: Normocephalic and atraumatic.  Eyes:     Conjunctiva/sclera: Conjunctivae normal.  Cardiovascular:     Rate and Rhythm: Normal rate and regular rhythm.     Heart sounds: Normal  heart sounds.  Pulmonary:     Effort: Pulmonary effort is normal.     Breath sounds: Normal breath sounds.  Abdominal:     General: Bowel sounds are normal.     Palpations: Abdomen is soft.  Musculoskeletal:     Cervical back: Normal range of motion.  Lymphadenopathy:     Cervical: No cervical adenopathy.  Skin:    General: Skin is warm and dry.     Capillary Refill: Capillary refill takes less than 2 seconds.  Neurological:     Mental Status: She is alert and oriented to person, place, and time.  Psychiatric:        Behavior: Behavior normal.     Musculoskeletal Exam: C-spine was in good range of motion.  Shoulder joints, elbow joints with good range of motion.  She had limited range of motion of her wrist joints.  There was no synovitis of her wrist joints, MCPs PIPs and DIPs.  Synovial thickening was noted.  Hip joints and knee joints in good range of motion without any warmth, swelling or effusion.  She had no tenderness over ankles or MTPs.  CDAI Exam: CDAI Score: 0.2  Patient Global: 1 mm; Provider Global: 1 mm Swollen: 0 ; Tender: 0  Joint Exam 12/11/2020   No joint exam has been documented for this visit   There is currently no information documented on the homunculus. Go to the Rheumatology activity and complete the homunculus joint exam.  Investigation: No additional findings.  Imaging: No results found.  Recent Labs: Lab Results  Component Value Date   WBC 5.6 04/08/2020   HGB 12.8 04/08/2020   PLT 404 (H) 04/08/2020   NA 141 04/08/2020   K 4.6 04/08/2020   CL 107 04/08/2020   CO2 26 04/08/2020   GLUCOSE 92 04/08/2020   BUN 16 04/08/2020   CREATININE 0.83 04/08/2020   BILITOT 0.6 04/08/2020   ALKPHOS 56 08/18/2016   AST 27 04/08/2020   ALT 30 (H) 04/08/2020   PROT 8.2 (H) 04/08/2020   ALBUMIN 4.3 08/18/2016   CALCIUM 10.3 04/08/2020   GFRAA 92 04/08/2020    Speciality Comments: No specialty comments available.  Procedures:  No procedures  performed Allergies: Grass extracts [gramineae pollens] and Pollen extract   Assessment / Plan:     Visit Diagnoses: Rheumatoid arthritis with rheumatoid factor of multiple sites without organ or systems involvement (HCC) - Positive RF, positive anti-CCP, elevated ESR, erosive disease: -She continues to have some joint to stiffness.  No synovitis was noted on the examination today.  She has limited range of motion of her wrist joints.  I will obtain follow-up x-rays to monitor for radiographic progression.  Plan: XR Hand 2 View Right, XR Hand 2 View Left, XR Foot 2 Views Right, XR Foot 2 Views Left.  X-rays of bilateral hands were reviewed today and compared to the x-rays of 2018.  There was slight progression of erosive changes in the left wrist joint.  No radiographic progression was noted in the right hand.  Bilateral feet x-ray showed no radiographic  progression.  I called patient and discussed results with her.  High risk medication use - Methotrexate 0.8 ML subcutaneous injections every week and folic acid 2 mg by mouth daily. - Plan: CBC with Differential/Platelet, COMPLETE METABOLIC PANEL WITH GFR today and then every 3 months to monitor for drug toxicity.  She has not had labs since December 2021.  Increased risk of toxicity without monitoring labs was emphasized.  She is supposed to get labs again in November and every 3 months.  She was also advised to stop methotrexate in case she develops an infection and restart after the infection resolves.  Updated information regarding the COVID-19 and the vaccines was placed in the AVS.  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 S/p orthovisc left knee, 07/08/2019, 07/15/2019, 07/22/2019.  Last Orthovisc injection was on June 22, 2020.  Elevated LFTs-her LFTs were elevated in December.  She states she was taking Aleve and NyQuil at the  time.  We will check LFTs again today.  Hyperglycemia  Elevated blood pressure reading-her blood pressure stays elevated.  She states is mostly elevated in the physician's office.  I have advised her to monitor blood pressure at home.  Increased risk of heart disease with rheumatoid arthritis was discussed.  Dietary modifications and exercises were discussed.  History of anxiety-she has history of anxiety.  She states her blood pressure was elevated today because of anxiety.  Have advised her to monitor blood pressure closely and follow-up with her PCP.  Noncompliance - Patient did not take any DMARDs for many years and developed more aggressive and severe disease.  She has been noncompliant with the lab work.  Although she has been taking her medications on a regular basis per patient.  Orders: Orders Placed This Encounter  Procedures   XR Hand 2 View Right   XR Hand 2 View Left   XR Foot 2 Views Right   XR Foot 2 Views Left   CBC with Differential/Platelet   COMPLETE METABOLIC PANEL WITH GFR    No orders of the defined types were placed in this encounter.    Follow-Up Instructions: Return in about 5 months (around 05/13/2021) for Rheumatoid arthritis.   Bo Merino, MD  Note - This record has been created using Editor, commissioning.  Chart creation errors have been sought, but may not always  have been located. Such creation errors do not reflect on  the standard of medical care.

## 2020-12-11 ENCOUNTER — Ambulatory Visit: Payer: Self-pay

## 2020-12-11 ENCOUNTER — Other Ambulatory Visit: Payer: Self-pay

## 2020-12-11 ENCOUNTER — Ambulatory Visit: Payer: Medicare PPO | Admitting: Rheumatology

## 2020-12-11 ENCOUNTER — Encounter: Payer: Self-pay | Admitting: Rheumatology

## 2020-12-11 VITALS — BP 184/74 | HR 101 | Ht 66.0 in | Wt 219.0 lb

## 2020-12-11 DIAGNOSIS — M79641 Pain in right hand: Secondary | ICD-10-CM

## 2020-12-11 DIAGNOSIS — R7989 Other specified abnormal findings of blood chemistry: Secondary | ICD-10-CM | POA: Diagnosis not present

## 2020-12-11 DIAGNOSIS — M0579 Rheumatoid arthritis with rheumatoid factor of multiple sites without organ or systems involvement: Secondary | ICD-10-CM | POA: Diagnosis not present

## 2020-12-11 DIAGNOSIS — R030 Elevated blood-pressure reading, without diagnosis of hypertension: Secondary | ICD-10-CM

## 2020-12-11 DIAGNOSIS — M79642 Pain in left hand: Secondary | ICD-10-CM

## 2020-12-11 DIAGNOSIS — Z79899 Other long term (current) drug therapy: Secondary | ICD-10-CM

## 2020-12-11 DIAGNOSIS — M79672 Pain in left foot: Secondary | ICD-10-CM

## 2020-12-11 DIAGNOSIS — Z9119 Patient's noncompliance with other medical treatment and regimen: Secondary | ICD-10-CM

## 2020-12-11 DIAGNOSIS — Z91199 Patient's noncompliance with other medical treatment and regimen due to unspecified reason: Secondary | ICD-10-CM

## 2020-12-11 DIAGNOSIS — M1712 Unilateral primary osteoarthritis, left knee: Secondary | ICD-10-CM | POA: Diagnosis not present

## 2020-12-11 DIAGNOSIS — M79671 Pain in right foot: Secondary | ICD-10-CM | POA: Diagnosis not present

## 2020-12-11 DIAGNOSIS — Z8659 Personal history of other mental and behavioral disorders: Secondary | ICD-10-CM

## 2020-12-11 DIAGNOSIS — R739 Hyperglycemia, unspecified: Secondary | ICD-10-CM

## 2020-12-11 LAB — CBC WITH DIFFERENTIAL/PLATELET
Absolute Monocytes: 384 cells/uL (ref 200–950)
Basophils Absolute: 30 cells/uL (ref 0–200)
Basophils Relative: 0.5 %
Eosinophils Absolute: 189 cells/uL (ref 15–500)
Eosinophils Relative: 3.2 %
HCT: 37.5 % (ref 35.0–45.0)
Hemoglobin: 12.1 g/dL (ref 11.7–15.5)
Lymphs Abs: 3027 cells/uL (ref 850–3900)
MCH: 27.8 pg (ref 27.0–33.0)
MCHC: 32.3 g/dL (ref 32.0–36.0)
MCV: 86 fL (ref 80.0–100.0)
MPV: 11.3 fL (ref 7.5–12.5)
Monocytes Relative: 6.5 %
Neutro Abs: 2272 cells/uL (ref 1500–7800)
Neutrophils Relative %: 38.5 %
Platelets: 369 10*3/uL (ref 140–400)
RBC: 4.36 10*6/uL (ref 3.80–5.10)
RDW: 12.3 % (ref 11.0–15.0)
Total Lymphocyte: 51.3 %
WBC: 5.9 10*3/uL (ref 3.8–10.8)

## 2020-12-11 LAB — COMPLETE METABOLIC PANEL WITH GFR
AG Ratio: 1.4 (calc) (ref 1.0–2.5)
ALT: 19 U/L (ref 6–29)
AST: 18 U/L (ref 10–35)
Albumin: 4.4 g/dL (ref 3.6–5.1)
Alkaline phosphatase (APISO): 85 U/L (ref 37–153)
BUN: 16 mg/dL (ref 7–25)
CO2: 25 mmol/L (ref 20–32)
Calcium: 9.7 mg/dL (ref 8.6–10.4)
Chloride: 105 mmol/L (ref 98–110)
Creat: 0.7 mg/dL (ref 0.50–1.03)
Globulin: 3.2 g/dL (calc) (ref 1.9–3.7)
Glucose, Bld: 205 mg/dL — ABNORMAL HIGH (ref 65–99)
Potassium: 4.3 mmol/L (ref 3.5–5.3)
Sodium: 140 mmol/L (ref 135–146)
Total Bilirubin: 0.5 mg/dL (ref 0.2–1.2)
Total Protein: 7.6 g/dL (ref 6.1–8.1)
eGFR: 102 mL/min/{1.73_m2} (ref >=60)

## 2020-12-11 NOTE — Patient Instructions (Signed)
Standing Labs We placed an order today for your standing lab work.   Please have your standing labs drawn in November and every 3 months  If possible, please have your labs drawn 2 weeks prior to your appointment so that the provider can discuss your results at your appointment.  Please note that you may see your imaging and lab results in MyChart before we have reviewed them. We may be awaiting multiple results to interpret others before contacting you. Please allow our office up to 72 hours to thoroughly review all of the results before contacting the office for clarification of your results.  We have open lab daily: Monday through Thursday from 1:30-4:30 PM and Friday from 1:30-4:00 PM at the office of Dr. Pollyann Savoy, [facility].   Please be advised, all patients with office appointments requiring lab work will take precedent over walk-in lab work.  If possible, please come for your lab work on Monday and Friday afternoons, as you may experience shorter wait times. The office is located at [address], [place], [place] [address] No appointment is necessary.   Labs are drawn by [facility]. Please bring your co-pay at the time of your lab draw.  You may receive a bill from [facility] for your lab work.  If you wish to have your labs drawn at another location, please call the office 24 hours in advance to send orders.  If you have any questions regarding directions or hours of operation,  please call [phone].   As a reminder, please drink plenty of water prior to coming for your lab work. Thanks!   If you test POSITIVE for COVID19 and have MILD to MODERATE symptoms: First, call your PCP if you would like to receive COVID19 treatment AND Hold your medications during the infection and for at least 1 week after your symptoms have resolved: Injectable medication (Benlysta, Cimzia, Cosentyx, Enbrel, Humira, Orencia, Remicade, Simponi, Stelara, Taltz,  Tremfya) Methotrexate Leflunomide (Arava) Azathioprine Mycophenolate (Cellcept) Osborne O[place], or Rinvoq Otezla If you take Actemra or Kevzara, you DO NOT need to hold these for COVID19 infection.  If you test POSITIVE for COVID19 and have NO symptoms: First, call your PCP if you would like to receive COVID19 treatment AND Hold your medications for at least 10 days after the day that you tested positive Injectable medication (Benlysta, Cimzia, Cosentyx, Enbrel, Humira, Orencia, Remicade, Simponi, Stelara, Taltz, Tremfya) Methotrexate Leflunomide (Arava) Azathioprine Mycophenolate (Cellcept) Osborne [place], or Rinvoq Otezla If you take Actemra or Kevzara, you DO NOT need to hold these for COVID19 infection.  If you have signs or symptoms of an infection or start antibiotics: First, call your PCP for workup of your infection. Hold your medication through the infection, until you complete your antibiotics, and until symptoms resolve if you take the following: Injectable medication (Actemra, Benlysta, Cimzia, Cosentyx, Enbrel, Humira, Kevzara, Orencia, Remicade, Simponi, Stelara, Taltz, Tremfya) Methotrexate Leflunomide (Arava) Mycophenolate (Cellcept) [name], Olumiant, or Rinvoq  Vaccines You are taking a medication(s) that can suppress your immune system.  The following immunizations are recommended: Flu annually Covid-19  Td/Tdap (tetanus, diphtheria, pertussis) every 10 years Pneumonia (Prevnar 15 then Pneumovax 23 at least 1 year apart.  Alternatively, can take Prevnar 20 without needing additional dose) Shingrix (after age 10): 2 doses from 4 weeks to 6 months apart  Please check with your PCP to make sure you are up to date.  COVID-19 vaccine recommendations:   COVID-19 vaccine is recommended for everyone (unless you  are allergic to a vaccine component), even if you are on a medication that suppresses your immune system.   If you are on Methotrexate, Cellcept  (mycophenolate), Rinvoq, [name], and Olumiant- hold the medication for 1 week after each vaccine. Hold Methotrexate for 2 weeks after the single dose COVID-19 vaccine.   If you are on Orencia subcutaneous injection - hold medication one week prior to and one week after the first COVID-19 vaccine dose (only).   If you are on Orencia IV infusions- time vaccination administration so that the first COVID-19 vaccination will occur four weeks after the infusion and postpone the subsequent infusion by one week.   If you are on Cyclophosphamide or Rituxan infusions please contact your doctor prior to receiving the COVID-19 vaccine.   Do not take Tylenol or any anti-inflammatory medications (NSAIDs) 24 hours prior to the COVID-19 vaccination.   There is no direct evidence about the efficacy of the COVID-19 vaccine in individuals who are on medications that suppress the immune system.   Even if you are fully vaccinated, and you are on any medications that suppress your immune system, please continue to wear a mask, maintain at least six feet social distance and practice hand hygiene.   If you develop a COVID-19 infection, please contact your PCP or our office to determine if you need monoclonal antibody infusion.  The booster vaccine is now available for immunocompromised patients.   Please see the following web sites for updated information.   [email or web address]://[email or web address]/[email or web address]-Vaccination-Patient-Resources.[email or web address]  Heart Disease Prevention   Your inflammatory disease increases your risk of heart disease which includes heart attack, stroke, atrial fibrillation (irregular heartbeats), high blood pressure, heart failure and atherosclerosis (plaque in the arteries).  It is important to reduce your risk by:   Keep blood pressure, cholesterol, and blood sugar at healthy levels   Smoking Cessation   Maintain a healthy weight  BMI 20-25   Eat a healthy diet  Plenty of fresh fruit,  vegetables, and whole grains  Limit saturated fats, foods high in sodium, and added sugars  DASH and Mediterranean diet   Increase physical activity  Recommend moderate physically activity for 150 minutes per week/ 30 minutes a day for five days a week These can be broken up into three separate ten-minute sessions during the day.   Reduce Stress  Meditation, slow breathing exercises, yoga, coloring books  Dental visits twice a year

## 2020-12-13 NOTE — Progress Notes (Signed)
CBC is normal.  CMP is normal except glucose is elevated.  Please notify patient and forward labs to her PCP.

## 2020-12-14 ENCOUNTER — Other Ambulatory Visit: Payer: Self-pay | Admitting: *Deleted

## 2020-12-14 MED ORDER — METHOTREXATE SODIUM CHEMO INJECTION 50 MG/2ML: 20.0000 mg | INTRAMUSCULAR | 0 refills | Status: DC

## 2020-12-14 NOTE — Telephone Encounter (Signed)
Next Visit: 05/13/2021  Last Visit: 12/11/2020  Last Fill: 34/16/2022  DX:  Rheumatoid arthritis with rheumatoid factor of multiple sites without organ or systems involvement   Current Dose per office note 12/11/2020: Methotrexate 0.8 ML subcutaneous injections every week  Labs: 12/11/2020, CBC is normal.  CMP is normal except glucose is elevated.  Please notify patient and forward labs to her PCP  Okay to refill MTX?

## 2021-01-02 ENCOUNTER — Other Ambulatory Visit: Payer: Self-pay | Admitting: Rheumatology

## 2021-01-04 ENCOUNTER — Encounter: Payer: Self-pay | Admitting: Rheumatology

## 2021-01-04 NOTE — Telephone Encounter (Signed)
Next Visit: 05/13/2021   Last Visit: 12/11/2020   Last Fill: 12/02/2019  Okay to refill Syringes?

## 2021-02-12 ENCOUNTER — Other Ambulatory Visit: Payer: Self-pay

## 2021-02-12 MED ORDER — METHOTREXATE SODIUM CHEMO INJECTION 50 MG/2ML: 20.0000 mg | INTRAMUSCULAR | 0 refills | Status: DC

## 2021-02-12 NOTE — Telephone Encounter (Signed)
Next Visit: 05/13/2021   Last Visit: 12/11/2020  Last Fill: 12/14/2020  DX: Rheumatoid arthritis with rheumatoid factor of multiple sites without organ or systems involvement   Current Dose per office note 12/11/2020: Methotrexate 0.8 ML subcutaneous injections every week   Labs: 12/11/2020 CBC is normal.  CMP is normal except glucose is elevated  Okay to refill MTX?

## 2021-03-02 ENCOUNTER — Other Ambulatory Visit: Payer: Self-pay | Admitting: Physician Assistant

## 2021-03-31 ENCOUNTER — Other Ambulatory Visit: Payer: Self-pay | Admitting: *Deleted

## 2021-03-31 DIAGNOSIS — Z79899 Other long term (current) drug therapy: Secondary | ICD-10-CM

## 2021-04-01 LAB — COMPLETE METABOLIC PANEL WITH GFR
AG Ratio: 1.3 (calc) (ref 1.0–2.5)
ALT: 18 U/L (ref 6–29)
AST: 19 U/L (ref 10–35)
Albumin: 4.4 g/dL (ref 3.6–5.1)
Alkaline phosphatase (APISO): 77 U/L (ref 37–153)
BUN: 13 mg/dL (ref 7–25)
CO2: 26 mmol/L (ref 20–32)
Calcium: 10 mg/dL (ref 8.6–10.4)
Chloride: 106 mmol/L (ref 98–110)
Creat: 0.91 mg/dL (ref 0.50–1.03)
Globulin: 3.3 g/dL (calc) (ref 1.9–3.7)
Glucose, Bld: 99 mg/dL (ref 65–99)
Potassium: 4.3 mmol/L (ref 3.5–5.3)
Sodium: 141 mmol/L (ref 135–146)
Total Bilirubin: 0.7 mg/dL (ref 0.2–1.2)
Total Protein: 7.7 g/dL (ref 6.1–8.1)
eGFR: 74 mL/min/{1.73_m2} (ref >=60)

## 2021-04-01 LAB — CBC WITH DIFFERENTIAL/PLATELET
Absolute Monocytes: 454 cells/uL (ref 200–950)
Basophils Absolute: 50 cells/uL (ref 0–200)
Basophils Relative: 0.9 %
Eosinophils Absolute: 230 cells/uL (ref 15–500)
Eosinophils Relative: 4.1 %
HCT: 35.6 % (ref 35.0–45.0)
Hemoglobin: 11.9 g/dL (ref 11.7–15.5)
Lymphs Abs: 2996 cells/uL (ref 850–3900)
MCH: 28.2 pg (ref 27.0–33.0)
MCHC: 33.4 g/dL (ref 32.0–36.0)
MCV: 84.4 fL (ref 80.0–100.0)
MPV: 11.5 fL (ref 7.5–12.5)
Monocytes Relative: 8.1 %
Neutro Abs: 1870 cells/uL (ref 1500–7800)
Neutrophils Relative %: 33.4 %
Platelets: 351 10*3/uL (ref 140–400)
RBC: 4.22 10*6/uL (ref 3.80–5.10)
RDW: 12.7 % (ref 11.0–15.0)
Total Lymphocyte: 53.5 %
WBC: 5.6 10*3/uL (ref 3.8–10.8)

## 2021-04-30 NOTE — Progress Notes (Deleted)
Office Visit Note  Patient: Debbie Williams             Date of Birth: 06/14/1965           MRN: [id number]             PCP: Harrison Mons, [place] Referring: Harrison Mons, [place] Visit Date: 05/13/2021 Occupation: @GUAROCC @  Subjective:  No chief complaint on file.   History of Present Illness: Debbie Williams is a 56 y.o. female ***   Activities of Daily Living:  Patient reports morning stiffness for *** {minute/hour:19697}.   Patient {ACTIONS;DENIES/REPORTS:21021675::"Denies"} nocturnal pain.  Difficulty dressing/grooming: {ACTIONS;DENIES/REPORTS:21021675::"Denies"} Difficulty climbing stairs: {ACTIONS;DENIES/REPORTS:21021675::"Denies"} Difficulty getting out of chair: {ACTIONS;DENIES/REPORTS:21021675::"Denies"} Difficulty using hands for taps, buttons, cutlery, and/or writing: {ACTIONS;DENIES/REPORTS:21021675::"Denies"}  No Rheumatology ROS completed.   PMFS History:  Patient Active Problem List   Diagnosis Date Noted   High risk medication use 03/21/2016   Anxiety 03/21/2016   Elevated blood pressure 10/12/2014   Allergic rhinitis due to pollen 10/12/2014   Rheumatoid arthritis with rheumatoid factor of multiple sites without organ or systems involvement ([place]) 09/18/2014   GAD (generalized anxiety disorder) 09/18/2014   Left knee pain 09/18/2014   Hyperglycemia 09/18/2014   BMI 36.0-36.9,adult 09/18/2014    Past Medical History:  Diagnosis Date   Anxiety    Arthritis    Rheumatoid   Depression    Diabetes mellitus without complication ([place])     Family History  Problem Relation Age of Onset   Hypertension Mother    Arthritis Mother    Diabetes Maternal Grandmother    Heart disease Maternal Grandmother    Arthritis Maternal Grandmother    Past Surgical History:  Procedure Laterality Date   INTRAUTERINE DEVICE INSERTION  09/2012   Mirena   Social History   Social History Narrative   Separated from her husband, though they share a home  for now.   Their daughter lives with them.   Immunization History  Administered Date(s) Administered   Influenza,inj,Quad PF,6+ Mos 02/10/2015   Moderna Sars-Covid-2 Vaccination 04/04/2020   PFIZER Comirnaty(Gray Top)Covid-19 Tri-Sucrose Vaccine 06/26/2019, 07/24/2019   Pneumococcal Polysaccharide-23 09/18/2014   Tdap 09/18/2014   Zoster, Live 01/14/2015     Objective: Vital Signs: LMP 09/01/2011    Physical Exam   Musculoskeletal Exam: ***  CDAI Exam: CDAI Score: -- Patient Global: --; Provider Global: -- Swollen: --; Tender: -- Joint Exam 05/13/2021   No joint exam has been documented for this visit   There is currently no information documented on the homunculus. Go to the Rheumatology activity and complete the homunculus joint exam.  Investigation: No additional findings.  Imaging: No results found.  Recent Labs: Lab Results  Component Value Date   WBC 5.6 03/31/2021   HGB 11.9 03/31/2021   PLT 351 03/31/2021   NA 141 03/31/2021   K 4.3 03/31/2021   CL 106 03/31/2021   CO2 26 03/31/2021   GLUCOSE 99 03/31/2021   BUN 13 03/31/2021   CREATININE 0.91 03/31/2021   BILITOT 0.7 03/31/2021   ALKPHOS 56 08/18/2016   AST 19 03/31/2021   ALT 18 03/31/2021   PROT 7.7 03/31/2021   ALBUMIN 4.3 08/18/2016   CALCIUM 10.0 03/31/2021   GFRAA 92 04/08/2020    Speciality Comments: No specialty comments available.  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  No orders of the defined types were placed in this encounter.   Face-to-face time  spent with patient was *** minutes. Greater than 50% of time was spent in counseling and coordination of care.  Follow-Up Instructions: No follow-ups on file.   Ofilia Neas, PA-C  Note - This record has been created using Dragon software.  Chart creation errors have been sought, but may not always  have been located. Such creation errors do not reflect on  the standard of medical care.

## 2021-05-13 ENCOUNTER — Ambulatory Visit: Payer: Medicare PPO | Admitting: Physician Assistant

## 2021-05-13 DIAGNOSIS — M1712 Unilateral primary osteoarthritis, left knee: Secondary | ICD-10-CM

## 2021-05-13 DIAGNOSIS — Z8659 Personal history of other mental and behavioral disorders: Secondary | ICD-10-CM

## 2021-05-13 DIAGNOSIS — Z79899 Other long term (current) drug therapy: Secondary | ICD-10-CM

## 2021-05-13 DIAGNOSIS — M0579 Rheumatoid arthritis with rheumatoid factor of multiple sites without organ or systems involvement: Secondary | ICD-10-CM

## 2021-05-13 DIAGNOSIS — R7989 Other specified abnormal findings of blood chemistry: Secondary | ICD-10-CM

## 2021-05-13 DIAGNOSIS — Z91199 Patient's noncompliance with other medical treatment and regimen due to unspecified reason: Secondary | ICD-10-CM

## 2021-05-15 ENCOUNTER — Other Ambulatory Visit: Payer: Self-pay | Admitting: Rheumatology

## 2021-05-17 NOTE — Telephone Encounter (Signed)
Next Visit: Due January 2023. Message sent to the front to schedule patient.    Last Visit: 12/11/2020   Last Fill: 02/12/2021   DX: Rheumatoid arthritis with rheumatoid factor of multiple sites without organ or systems involvement    Current Dose per office note 12/11/2020: Methotrexate 0.8 ML subcutaneous injections every week    Labs: 03/31/2021 CBC and CMP WNL   Okay to refill MTX?

## 2021-05-17 NOTE — Telephone Encounter (Signed)
Please schedule patient a follow up visit. Patient due January 2023. Thanks!  °

## 2021-05-17 NOTE — Telephone Encounter (Signed)
Attempted to contact patient to schedule follow up appointment. Mailbox full.

## 2021-05-27 NOTE — Progress Notes (Deleted)
Office Visit Note  Patient: Debbie Williams             Date of Birth: 05/28/1965           MRN: [id number]             PCP: Harrison Mons, PA Referring: Harrison Mons, PA Visit Date: 06/09/2021 Occupation: @GUAROCC @  Subjective:    History of Present Illness: Debbie Williams is a 56 y.o. female with history of seropositive rheumatoid arthritis and osteoarthritis.  Patient is on methotrexate 0.8 mL sq injections once weekly and folic acid 2 mg daily.  CBC and CMP drawn on 03/31/2021. Discussed the importance of holding methotrexate if she develops signs or symptoms of an infection and to resume once the infection has completely cleared.  Activities of Daily Living:  Patient reports morning stiffness for *** {minute/hour:19697}.   Patient {ACTIONS;DENIES/REPORTS:21021675::"Denies"} nocturnal pain.  Difficulty dressing/grooming: {ACTIONS;DENIES/REPORTS:21021675::"Denies"} Difficulty climbing stairs: {ACTIONS;DENIES/REPORTS:21021675::"Denies"} Difficulty getting out of chair: {ACTIONS;DENIES/REPORTS:21021675::"Denies"} Difficulty using hands for taps, buttons, cutlery, and/or writing: {ACTIONS;DENIES/REPORTS:21021675::"Denies"}  No Rheumatology ROS completed.   PMFS History:  Patient Active Problem List   Diagnosis Date Noted   High risk medication use 03/21/2016   Anxiety 03/21/2016   Elevated blood pressure 10/12/2014   Allergic rhinitis due to pollen 10/12/2014   Rheumatoid arthritis with rheumatoid factor of multiple sites without organ or systems involvement ([place]) 09/18/2014   GAD (generalized anxiety disorder) 09/18/2014   Left knee pain 09/18/2014   Hyperglycemia 09/18/2014   BMI 36.0-36.9,adult 09/18/2014    Past Medical History:  Diagnosis Date   Anxiety    Arthritis    Rheumatoid   Depression    Diabetes mellitus without complication ([place])     Family History  Problem Relation Age of Onset   Hypertension Mother    Arthritis Mother     Diabetes Maternal Grandmother    Heart disease Maternal Grandmother    Arthritis Maternal Grandmother    Past Surgical History:  Procedure Laterality Date   INTRAUTERINE DEVICE INSERTION  09/2012   Mirena   Social History   Social History Narrative   Separated from her husband, though they share a home for now.   Their daughter lives with them.   Immunization History  Administered Date(s) Administered   Influenza,inj,Quad PF,6+ Mos 02/10/2015   Moderna Sars-Covid-2 Vaccination 04/04/2020   PFIZER Comirnaty(Gray Top)Covid-19 Tri-Sucrose Vaccine 06/26/2019, 07/24/2019   Pneumococcal Polysaccharide-23 09/18/2014   Tdap 09/18/2014   Zoster, Live 01/14/2015     Objective: Vital Signs: LMP 09/01/2011    Physical Exam Vitals and nursing note reviewed.  Constitutional:      Appearance: She is well-developed.  HENT:     Head: Normocephalic and atraumatic.  Eyes:     Conjunctiva/sclera: Conjunctivae normal.  Cardiovascular:     Rate and Rhythm: Normal rate and regular rhythm.     Heart sounds: Normal heart sounds.  Pulmonary:     Effort: Pulmonary effort is normal.     Breath sounds: Normal breath sounds.  Abdominal:     General: Bowel sounds are normal.     Palpations: Abdomen is soft.  Musculoskeletal:     Cervical back: Normal range of motion.  Lymphadenopathy:     Cervical: No cervical adenopathy.  Skin:    General: Skin is warm and dry.     Capillary Refill: Capillary refill takes less than 2 seconds.  Neurological:     Mental Status: She is alert and oriented to person, place, and time.  Psychiatric:  Behavior: Behavior normal.     Musculoskeletal Exam: ***  CDAI Exam: CDAI Score: -- Patient Global: --; Provider Global: -- Swollen: --; Tender: -- Joint Exam 06/09/2021   No joint exam has been documented for this visit   There is currently no information documented on the homunculus. Go to the Rheumatology activity and complete the homunculus  joint exam.  Investigation: No additional findings.  Imaging: No results found.  Recent Labs: Lab Results  Component Value Date   WBC 5.6 03/31/2021   HGB 11.9 03/31/2021   PLT 351 03/31/2021   NA 141 03/31/2021   K 4.3 03/31/2021   CL 106 03/31/2021   CO2 26 03/31/2021   GLUCOSE 99 03/31/2021   BUN 13 03/31/2021   CREATININE 0.91 03/31/2021   BILITOT 0.7 03/31/2021   ALKPHOS 56 08/18/2016   AST 19 03/31/2021   ALT 18 03/31/2021   PROT 7.7 03/31/2021   ALBUMIN 4.3 08/18/2016   CALCIUM 10.0 03/31/2021   GFRAA 92 04/08/2020    Speciality Comments: No specialty comments available.  Procedures:  No procedures performed Allergies: Grass extracts [gramineae pollens] and Pollen extract   Assessment / Plan:     Visit Diagnoses: No diagnosis found.  Orders: No orders of the defined types were placed in this encounter.  No orders of the defined types were placed in this encounter.   Face-to-face time spent with patient was *** minutes. Greater than 50% of time was spent in counseling and coordination of care.  Follow-Up Instructions: No follow-ups on file.   Earnestine Mealing, CMA  Note - This record has been created using Editor, commissioning.  Chart creation errors have been sought, but may not always  have been located. Such creation errors do not reflect on  the standard of medical care.

## 2021-06-09 ENCOUNTER — Ambulatory Visit: Payer: Medicare PPO | Admitting: Physician Assistant

## 2021-06-09 DIAGNOSIS — R739 Hyperglycemia, unspecified: Secondary | ICD-10-CM

## 2021-06-09 DIAGNOSIS — Z79899 Other long term (current) drug therapy: Secondary | ICD-10-CM

## 2021-06-09 DIAGNOSIS — M1712 Unilateral primary osteoarthritis, left knee: Secondary | ICD-10-CM

## 2021-06-09 DIAGNOSIS — Z91199 Patient's noncompliance with other medical treatment and regimen due to unspecified reason: Secondary | ICD-10-CM

## 2021-06-09 DIAGNOSIS — M0579 Rheumatoid arthritis with rheumatoid factor of multiple sites without organ or systems involvement: Secondary | ICD-10-CM

## 2021-06-09 DIAGNOSIS — Z8659 Personal history of other mental and behavioral disorders: Secondary | ICD-10-CM

## 2021-06-09 DIAGNOSIS — R7989 Other specified abnormal findings of blood chemistry: Secondary | ICD-10-CM

## 2021-06-16 NOTE — Progress Notes (Unsigned)
Office Visit Note  Patient: Debbie Williams             Date of Birth: [birth year]           MRN: [id number]             PCP: Harrison Mons, [place] Referring: Harrison Mons, [place] Visit Date: 06/30/2021 Occupation: @GUAROCC @  Subjective:  No chief complaint on file.   History of Present Illness: Debbie Williams is a 56 y.o. female ***   Activities of Daily Living:  Patient reports morning stiffness for *** {minute/hour:19697}.   Patient {ACTIONS;DENIES/REPORTS:21021675::"Denies"} nocturnal pain.  Difficulty dressing/grooming: {ACTIONS;DENIES/REPORTS:21021675::"Denies"} Difficulty climbing stairs: {ACTIONS;DENIES/REPORTS:21021675::"Denies"} Difficulty getting out of chair: {ACTIONS;DENIES/REPORTS:21021675::"Denies"} Difficulty using hands for taps, buttons, cutlery, and/or writing: {ACTIONS;DENIES/REPORTS:21021675::"Denies"}  No Rheumatology ROS completed.   PMFS History:  Patient Active Problem List   Diagnosis Date Noted   High risk medication use 03/21/2016   Anxiety 03/21/2016   Elevated blood pressure 10/12/2014   Allergic rhinitis due to pollen 10/12/2014   Rheumatoid arthritis with rheumatoid factor of multiple sites without organ or systems involvement ([place]) 09/18/2014   GAD (generalized anxiety disorder) 09/18/2014   Left knee pain 09/18/2014   Hyperglycemia 09/18/2014   BMI 36.0-36.9,adult 09/18/2014    Past Medical History:  Diagnosis Date   Anxiety    Arthritis    Rheumatoid   Depression    Diabetes mellitus without complication ([place])     Family History  Problem Relation Age of Onset   Hypertension Mother    Arthritis Mother    Diabetes Maternal Grandmother    Heart disease Maternal Grandmother    Arthritis Maternal Grandmother    Past Surgical History:  Procedure Laterality Date   INTRAUTERINE DEVICE INSERTION  09/2012   Mirena   Social History   Social History Narrative   Separated from her husband, though they share a home  for now.   Their daughter lives with them.   Immunization History  Administered Date(s) Administered   Influenza,inj,Quad PF,6+ Mos 02/10/2015   Moderna Sars-Covid-2 Vaccination 04/04/2020   PFIZER Comirnaty(Gray Top)Covid-19 Tri-Sucrose Vaccine 06/26/2019, 07/24/2019   Pneumococcal Polysaccharide-23 09/18/2014   Tdap 09/18/2014   Zoster, Live 01/14/2015     Objective: Vital Signs: LMP 09/01/2011    Physical Exam   Musculoskeletal Exam: ***  CDAI Exam: CDAI Score: -- Patient Global: --; Provider Global: -- Swollen: --; Tender: -- Joint Exam 06/30/2021   No joint exam has been documented for this visit   There is currently no information documented on the homunculus. Go to the Rheumatology activity and complete the homunculus joint exam.  Investigation: No additional findings.  Imaging: No results found.  Recent Labs: Lab Results  Component Value Date   WBC 5.6 03/31/2021   HGB 11.9 03/31/2021   PLT 351 03/31/2021   NA 141 03/31/2021   K 4.3 03/31/2021   CL 106 03/31/2021   CO2 26 03/31/2021   GLUCOSE 99 03/31/2021   BUN 13 03/31/2021   CREATININE 0.91 03/31/2021   BILITOT 0.7 03/31/2021   ALKPHOS 56 08/18/2016   AST 19 03/31/2021   ALT 18 03/31/2021   PROT 7.7 03/31/2021   ALBUMIN 4.3 08/18/2016   CALCIUM 10.0 03/31/2021   GFRAA 92 04/08/2020    Speciality Comments: No specialty comments available.  Procedures:  No procedures performed Allergies: Grass extracts [gramineae pollens] and Pollen extract   Assessment / Plan:     Visit Diagnoses: No diagnosis found.  Orders: No orders of the defined types were  placed in this encounter.  No orders of the defined types were placed in this encounter.   Face-to-face time spent with patient was *** minutes. Greater than 50% of time was spent in counseling and coordination of care.  Follow-Up Instructions: No follow-ups on file.   Earnestine Mealing, CMA  Note - This record has been created using  Editor, commissioning.  Chart creation errors have been sought, but may not always  have been located. Such creation errors do not reflect on  the standard of medical care.

## 2021-06-30 ENCOUNTER — Other Ambulatory Visit: Payer: Self-pay

## 2021-06-30 ENCOUNTER — Ambulatory Visit (INDEPENDENT_AMBULATORY_CARE_PROVIDER_SITE_OTHER): Payer: Medicare PPO | Admitting: Physician Assistant

## 2021-06-30 ENCOUNTER — Encounter: Payer: Self-pay | Admitting: Physician Assistant

## 2021-06-30 VITALS — BP 164/84 | HR 103 | Ht 66.5 in | Wt 221.6 lb

## 2021-06-30 DIAGNOSIS — Z79899 Other long term (current) drug therapy: Secondary | ICD-10-CM | POA: Diagnosis not present

## 2021-06-30 DIAGNOSIS — R7989 Other specified abnormal findings of blood chemistry: Secondary | ICD-10-CM

## 2021-06-30 DIAGNOSIS — M0579 Rheumatoid arthritis with rheumatoid factor of multiple sites without organ or systems involvement: Secondary | ICD-10-CM

## 2021-06-30 DIAGNOSIS — Z8659 Personal history of other mental and behavioral disorders: Secondary | ICD-10-CM

## 2021-06-30 DIAGNOSIS — M1712 Unilateral primary osteoarthritis, left knee: Secondary | ICD-10-CM

## 2021-06-30 DIAGNOSIS — Z91199 Patient's noncompliance with other medical treatment and regimen due to unspecified reason: Secondary | ICD-10-CM

## 2021-06-30 DIAGNOSIS — R739 Hyperglycemia, unspecified: Secondary | ICD-10-CM

## 2021-06-30 NOTE — Patient Instructions (Signed)
Standing Labs ?We placed an order today for your standing lab work.  ? ?Please have your standing labs drawn this month and every 3 months  ? ?If possible, please have your labs drawn 2 weeks prior to your appointment so that the provider can discuss your results at your appointment. ? ?Please note that you may see your imaging and lab results in MyChart before we have reviewed them. ?We may be awaiting multiple results to interpret others before contacting you. ?Please allow our office up to 72 hours to thoroughly review all of the results before contacting the office for clarification of your results. ? ?We have open lab daily: ?Monday through Thursday from 1:30-4:30 PM and Friday from 1:30-4:00 PM ?at the office of Dr. Pollyann Savoy, [facility].   ?Please be advised, all patients with office appointments requiring lab work will take precedent over walk-in lab work.  ?If possible, please come for your lab work on Monday and Friday afternoons, as you may experience shorter wait times. ?The office is located at [address], [place], [place] [address] ?No appointment is necessary.   ?Labs are drawn by [facility]. Please bring your co-pay at the time of your lab draw.  You may receive a bill from [facility] for your lab work. ? ?Please note if you are on Hydroxychloroquine and and an order has been placed for a Hydroxychloroquine level, you will need to have it drawn 4 hours or more after your last dose. ? ?If you wish to have your labs drawn at another location, please call the office 24 hours in advance to send orders. ? ?If you have any questions regarding directions or hours of operation,  ?please call [phone].   ?As a reminder, please drink plenty of water prior to coming for your lab work. Thanks! ? ?

## 2021-08-04 ENCOUNTER — Other Ambulatory Visit: Payer: Self-pay | Admitting: *Deleted

## 2021-08-04 DIAGNOSIS — Z79899 Other long term (current) drug therapy: Secondary | ICD-10-CM

## 2021-08-04 DIAGNOSIS — R7989 Other specified abnormal findings of blood chemistry: Secondary | ICD-10-CM

## 2021-08-05 LAB — CBC WITH DIFFERENTIAL/PLATELET
Absolute Monocytes: 459 cells/uL (ref 200–950)
Basophils Absolute: 50 cells/uL (ref 0–200)
Basophils Relative: 0.8 %
Eosinophils Absolute: 167 cells/uL (ref 15–500)
Eosinophils Relative: 2.7 %
HCT: 38.3 % (ref 35.0–45.0)
Hemoglobin: 12.7 g/dL (ref 11.7–15.5)
Lymphs Abs: 3491 cells/uL (ref 850–3900)
MCH: 27.7 pg (ref 27.0–33.0)
MCHC: 33.2 g/dL (ref 32.0–36.0)
MCV: 83.6 fL (ref 80.0–100.0)
MPV: 11.8 fL (ref 7.5–12.5)
Monocytes Relative: 7.4 %
Neutro Abs: 2034 cells/uL (ref 1500–7800)
Neutrophils Relative %: 32.8 %
Platelets: 376 10*3/uL (ref 140–400)
RBC: 4.58 10*6/uL (ref 3.80–5.10)
RDW: 12.8 % (ref 11.0–15.0)
Total Lymphocyte: 56.3 %
WBC: 6.2 10*3/uL (ref 3.8–10.8)

## 2021-08-05 LAB — COMPLETE METABOLIC PANEL WITH GFR
AG Ratio: 1.4 (calc) (ref 1.0–2.5)
ALT: 22 U/L (ref 6–29)
AST: 20 U/L (ref 10–35)
Albumin: 4.6 g/dL (ref 3.6–5.1)
Alkaline phosphatase (APISO): 83 U/L (ref 37–153)
BUN: 18 mg/dL (ref 7–25)
CO2: 23 mmol/L (ref 20–32)
Calcium: 9.9 mg/dL (ref 8.6–10.4)
Chloride: 105 mmol/L (ref 98–110)
Creat: 0.85 mg/dL (ref 0.50–1.03)
Globulin: 3.4 g/dL (calc) (ref 1.9–3.7)
Glucose, Bld: 123 mg/dL — ABNORMAL HIGH (ref 65–99)
Potassium: 4.2 mmol/L (ref 3.5–5.3)
Sodium: 139 mmol/L (ref 135–146)
Total Bilirubin: 0.8 mg/dL (ref 0.2–1.2)
Total Protein: 8 g/dL (ref 6.1–8.1)
eGFR: 80 mL/min/{1.73_m2} (ref >=60)

## 2021-08-05 NOTE — Progress Notes (Deleted)
56 y.o. [id number] Legally Separated African American female here for annual exam.    PCP:     Patient's last menstrual period was 09/01/2011.           Sexually active: {yes no:314532}  The current method of family planning is {contraception:315051}.    Exercising: {yes no:314532}  {types:19826} Smoker:  {YES NO:22349}  Health Maintenance: Pap:  08-02-17 Neg:Neg HR HPV, 09-20-12 Neg:Neg HR HPV History of abnormal Pap:  {YES NO:22349} MMG:  ***03-20-20 Neg/BiRads1 Colonoscopy:  ***NEVER BMD:   ***  Result  *** TDaP:  09-18-14 Gardasil:   N/A HIV:*** Hep C:*** Screening Labs:  Hb today: ***, Urine today: ***   reports that she has never smoked. She has never been exposed to tobacco smoke. She has never used smokeless tobacco. She reports current alcohol use. She reports that she does not use drugs.  Past Medical History:  Diagnosis Date   Anxiety    Arthritis    Rheumatoid   Depression    Diabetes mellitus without complication [facility])     Past Surgical History:  Procedure Laterality Date   INTRAUTERINE DEVICE INSERTION  09/2012   Mirena    Current Outpatient Medications  Medication Sig Dispense Refill   APPLE CIDER VINEGAR PO Take by mouth 2 (two) times daily.     B-D TB SYRINGE 1CC/27GX1/2" 27G X 1/2" 1 ML MISC TO USE WITH INJECTABLE METHOTREXATE ONCE WEEKLY 12 each 3   Blood Glucose Monitoring Suppl (ONE TOUCH ULTRA 2) w/Device KIT Use to check blood sugar daily (alternating fasting and 2-3 hours after largest meal of the day)     Blood Glucose Monitoring Suppl (ONE TOUCH ULTRA 2) w/Device KIT      clonazePAM (KLONOPIN) 1 MG tablet Take 1-2 mg by mouth at bedtime as needed.     folic acid (FOLVITE) 1 MG tablet TAKE 2 TABLETS BY MOUTH EVERY DAY 180 tablet 3   LIFESCAN FINEPOINT LANCETS MISC Use to check blood sugar one time(s) daily.     methotrexate 250 MG/10ML injection INJECT 0.8 MLS (20 MG TOTAL) INTO THE SKIN ONCE A WEEK. 10 mL 0   Multiple Vitamin (MULTIVITAMIN) tablet  Take 1 tablet by mouth daily.     Omega-3 Fatty Acids (FISH OIL) 1000 MG CAPS Take 1 capsule by mouth daily. (Patient not taking: Reported on 06/30/2021)     Omega-3 Fatty Acids (SALMON OIL PO) Take by mouth daily.     ONE TOUCH ULTRA TEST test strip      sertraline (ZOLOFT) 100 MG tablet Take 200 mg by mouth daily.      No current facility-administered medications for this visit.    Family History  Problem Relation Age of Onset   Hypertension Mother    Arthritis Mother    Diabetes Maternal Grandmother    Heart disease Maternal Grandmother    Arthritis Maternal Grandmother     Review of Systems  Exam:   LMP 09/01/2011     General appearance: alert, cooperative and appears stated age Head: normocephalic, without obvious abnormality, atraumatic Neck: no adenopathy, supple, symmetrical, trachea midline and thyroid normal to inspection and palpation Lungs: clear to auscultation bilaterally Breasts: normal appearance, no masses or tenderness, No nipple retraction or dimpling, No nipple discharge or bleeding, No axillary adenopathy Heart: regular rate and rhythm Abdomen: soft, non-tender; no masses, no organomegaly Extremities: extremities normal, atraumatic, no cyanosis or edema Skin: skin color, texture, turgor normal. No rashes or lesions Lymph nodes: cervical, supraclavicular, and  axillary nodes normal. Neurologic: grossly normal  Pelvic: External genitalia:  no lesions              No abnormal inguinal nodes palpated.              Urethra:  normal appearing urethra with no masses, tenderness or lesions              Bartholins and Skenes: normal                 Vagina: normal appearing vagina with normal color and discharge, no lesions              Cervix: no lesions              Pap taken: {yes no:314532} Bimanual Exam:  Uterus:  normal size, contour, position, consistency, mobility, non-tender              Adnexa: no mass, fullness, tenderness              Rectal exam: {yes  no:314532}.  Confirms.              Anus:  normal sphincter tone, no lesions  Chaperone was present for exam:  ***  Assessment:   Well woman visit with gynecologic exam.   Plan: Mammogram screening discussed. Self breast awareness reviewed. Pap and HR HPV as above. Guidelines for Calcium, Vitamin D, regular exercise program including cardiovascular and weight bearing exercise.   Follow up annually and prn.   Additional counseling given.  {yes [id number]. _______ minutes face to face time of which over 50% was spent in counseling.    After visit summary provided.

## 2021-08-05 NOTE — Progress Notes (Signed)
Glucose is 123. Rest of CMP WNL.  CBC Wnl.

## 2021-08-10 ENCOUNTER — Ambulatory Visit: Payer: Medicare PPO | Admitting: Obstetrics and Gynecology

## 2021-08-24 ENCOUNTER — Encounter: Payer: Self-pay | Admitting: Rheumatology

## 2021-08-24 NOTE — Telephone Encounter (Signed)
Please clarify if she would like [place] to apply for gel injections for both knees or just the left knee.  The patient will require updated x-rays at her first scheduled appointment.

## 2021-08-31 ENCOUNTER — Telehealth: Payer: Self-pay | Admitting: Rheumatology

## 2021-08-31 NOTE — Telephone Encounter (Signed)
Please call to schedule visco injections. ? ?Approved for Orthovisc, left knee ?[place] ?Covered at 100% once OOP has been met (Met: $61.20/$4000) ?$40 Copay ?Deductible does not apply ?No pre-certifications  ? ?

## 2021-08-31 NOTE — Telephone Encounter (Signed)
VOB submitted for Orthovisc, left knee. BV pending. 

## 2021-09-08 ENCOUNTER — Ambulatory Visit: Payer: Medicare PPO | Admitting: Physician Assistant

## 2021-09-08 DIAGNOSIS — M1712 Unilateral primary osteoarthritis, left knee: Secondary | ICD-10-CM

## 2021-09-08 MED ORDER — HYALURONAN 30 MG/2ML IX SOSY
30.0000 mg | PREFILLED_SYRINGE | INTRA_ARTICULAR | Status: AC | PRN
  Administered 2021-09-08: 30 mg via INTRA_ARTICULAR

## 2021-09-08 MED ORDER — LIDOCAINE HCL 1 % IJ SOLN
1.5000 mL | INTRAMUSCULAR | Status: AC | PRN
  Administered 2021-09-08: 1.5 mL

## 2021-09-08 NOTE — Progress Notes (Signed)
   Procedure Note  Patient: Debbie Williams             Date of Birth: 1965/08/04           MRN: [id number]             Visit Date: 09/08/2021  Procedures: Visit Diagnoses:  1. Primary osteoarthritis and rheumatoid arthritis of left knee    Orthovisc #1 Left knee joint injection  Large Joint Inj: L knee on 09/08/2021 1:53 PM Indications: pain Details: 25 G 1.5 in needle, medial approach  Arthrogram: No  Medications: 1.5 mL lidocaine 1 %; 30 mg Hyaluronan 30 MG/2ML Aspirate: 0 mL Outcome: tolerated well, no immediate complications Procedure, treatment alternatives, risks and benefits explained, specific risks discussed. Consent was given by the patient. Immediately prior to procedure a time out was called to verify the correct patient, procedure, equipment, support staff and site/side marked as required. Patient was prepped and draped in the usual sterile fashion.     Patient tolerated the procedure well.  Aftercare was discussed.  Sherron Ales, PA-C

## 2021-09-15 ENCOUNTER — Ambulatory Visit: Payer: Medicare PPO | Admitting: Physician Assistant

## 2021-09-15 DIAGNOSIS — M1712 Unilateral primary osteoarthritis, left knee: Secondary | ICD-10-CM | POA: Diagnosis not present

## 2021-09-15 MED ORDER — LIDOCAINE HCL 1 % IJ SOLN
1.5000 mL | INTRAMUSCULAR | Status: AC | PRN
  Administered 2021-09-15: 1.5 mL

## 2021-09-15 MED ORDER — HYALURONAN 30 MG/2ML IX SOSY
30.0000 mg | PREFILLED_SYRINGE | INTRA_ARTICULAR | Status: AC | PRN
  Administered 2021-09-15: 30 mg via INTRA_ARTICULAR

## 2021-09-15 NOTE — Progress Notes (Signed)
   Procedure Note  Patient: Debbie Williams             Date of Birth: 12-11-1965           MRN: [id number]             Visit Date: 09/15/2021  Procedures: Visit Diagnoses:  1. Primary osteoarthritis and rheumatoid arthritis of left knee    Orthovisc #2 Left knee joint only  Large Joint Inj: L knee on 09/15/2021 1:47 PM Indications: pain Details: 25 G 1.5 in needle, medial approach  Arthrogram: No  Medications: 1.5 mL lidocaine 1 %; 30 mg Hyaluronan 30 MG/2ML Aspirate: 0 mL Outcome: tolerated well, no immediate complications Procedure, treatment alternatives, risks and benefits explained, specific risks discussed. Consent was given by the patient. Immediately prior to procedure a time out was called to verify the correct patient, procedure, equipment, support staff and site/side marked as required. Patient was prepped and draped in the usual sterile fashion.      Patient tolerated the procedure well.  Aftercare was discussed.  Sherron Ales, PA-C

## 2021-09-22 ENCOUNTER — Ambulatory Visit: Payer: Medicare PPO | Admitting: Physician Assistant

## 2021-09-22 DIAGNOSIS — M1712 Unilateral primary osteoarthritis, left knee: Secondary | ICD-10-CM

## 2021-09-22 MED ORDER — HYALURONAN 30 MG/2ML IX SOSY
30.0000 mg | PREFILLED_SYRINGE | INTRA_ARTICULAR | Status: AC | PRN
  Administered 2021-09-22: 30 mg via INTRA_ARTICULAR

## 2021-09-22 MED ORDER — LIDOCAINE HCL 1 % IJ SOLN
1.5000 mL | INTRAMUSCULAR | Status: AC | PRN
  Administered 2021-09-22: 1.5 mL

## 2021-09-22 NOTE — Progress Notes (Signed)
   Procedure Note  Patient: Debbie Williams             Date of Birth: 09-25-1965           MRN: [id number]             Visit Date: 09/22/2021  Procedures: Visit Diagnoses:  1. Primary osteoarthritis and rheumatoid arthritis of left knee    Orthovisc #3 Left knee joint injection  Large Joint Inj: L knee on 09/22/2021 2:48 PM Indications: pain Details: 25 G 1.5 in needle, medial approach  Arthrogram: No  Medications: 1.5 mL lidocaine 1 %; 30 mg Hyaluronan 30 MG/2ML Aspirate: 0 mL Outcome: tolerated well, no immediate complications Procedure, treatment alternatives, risks and benefits explained, specific risks discussed. Consent was given by the patient. Immediately prior to procedure a time out was called to verify the correct patient, procedure, equipment, support staff and site/side marked as required. Patient was prepped and draped in the usual sterile fashion.   Patient tolerated the procedure well.  Aftercare was discussed.  Sherron Ales, PA-C

## 2021-09-24 ENCOUNTER — Other Ambulatory Visit: Payer: Self-pay | Admitting: Physician Assistant

## 2021-09-24 NOTE — Telephone Encounter (Signed)
Next Visit: 12/01/2021  Last Visit: 06/30/2021  Last Fill:08/03/2020  Dx: Rheumatoid arthritis with rheumatoid factor of multiple sites without organ or systems involvement   Current Dose per office note on 06/30/2021: folic acid 2 mg daily.  Okay to refill folic acid?

## 2021-11-17 NOTE — Progress Notes (Deleted)
Office Visit Note  Patient: Debbie Williams             Date of Birth: [birth year]           MRN: [id number]             PCP: Porfirio Oar, PA Referring: Porfirio Oar, PA Visit Date: 12/01/2021 Occupation: @GUAROCC @  Subjective:  No chief complaint on file.   History of Present Illness: Debbie Williams is a 56 y.o. female ***   Activities of Daily Living:  Patient reports morning stiffness for *** {minute/hour:19697}.   Patient {ACTIONS;DENIES/REPORTS:21021675::"Denies"} nocturnal pain.  Difficulty dressing/grooming: {ACTIONS;DENIES/REPORTS:21021675::"Denies"} Difficulty climbing stairs: {ACTIONS;DENIES/REPORTS:21021675::"Denies"} Difficulty getting out of chair: {ACTIONS;DENIES/REPORTS:21021675::"Denies"} Difficulty using hands for taps, buttons, cutlery, and/or writing: {ACTIONS;DENIES/REPORTS:21021675::"Denies"}  No Rheumatology ROS completed.   PMFS History:  Patient Active Problem List   Diagnosis Date Noted   High risk medication use 03/21/2016   Anxiety 03/21/2016   Elevated blood pressure 10/12/2014   Allergic rhinitis due to pollen 10/12/2014   Rheumatoid arthritis with rheumatoid factor of multiple sites without organ or systems involvement ([facility]) 09/18/2014   GAD (generalized anxiety disorder) 09/18/2014   Left knee pain 09/18/2014   Hyperglycemia 09/18/2014   BMI 36.0-36.9,adult 09/18/2014    Past Medical History:  Diagnosis Date   Anxiety    Arthritis    Rheumatoid   Depression    Diabetes mellitus without complication ([facility])     Family History  Problem Relation Age of Onset   Hypertension Mother    Arthritis Mother    Diabetes Maternal Grandmother    Heart disease Maternal Grandmother    Arthritis Maternal Grandmother    Past Surgical History:  Procedure Laterality Date   INTRAUTERINE DEVICE INSERTION  09/2012   Mirena   Social History   Social History Narrative   Separated from her husband, though they share a home  for now.   Their daughter lives with them.   Immunization History  Administered Date(s) Administered   Influenza,inj,Quad PF,6+ Mos 02/10/2015   Moderna Sars-Covid-2 Vaccination 04/04/2020   PFIZER Comirnaty(Gray Top)Covid-19 Tri-Sucrose Vaccine 06/26/2019, 07/24/2019   Pneumococcal Polysaccharide-23 09/18/2014   Tdap 09/18/2014   Zoster, Live 01/14/2015     Objective: Vital Signs: LMP 09/01/2011    Physical Exam   Musculoskeletal Exam: ***  CDAI Exam: CDAI Score: -- Patient Global: --; Provider Global: -- Swollen: --; Tender: -- Joint Exam 12/01/2021   No joint exam has been documented for this visit   There is currently no information documented on the homunculus. Go to the Rheumatology activity and complete the homunculus joint exam.  Investigation: No additional findings.  Imaging: No results found.  Recent Labs: Lab Results  Component Value Date   WBC 6.2 08/04/2021   HGB 12.7 08/04/2021   PLT 376 08/04/2021   NA 139 08/04/2021   K 4.2 08/04/2021   CL 105 08/04/2021   CO2 23 08/04/2021   GLUCOSE 123 (H) 08/04/2021   BUN 18 08/04/2021   CREATININE 0.85 08/04/2021   BILITOT 0.8 08/04/2021   ALKPHOS 56 08/18/2016   AST 20 08/04/2021   ALT 22 08/04/2021   PROT 8.0 08/04/2021   ALBUMIN 4.3 08/18/2016   CALCIUM 9.9 08/04/2021   GFRAA 92 04/08/2020    Speciality Comments: No specialty comments available.  Procedures:  No procedures performed Allergies: Grass extracts [gramineae pollens] and Pollen extract   Assessment / Plan:     Visit Diagnoses: No diagnosis found.  Orders: No orders of the defined types  were placed in this encounter.  No orders of the defined types were placed in this encounter.   Face-to-face time spent with patient was *** minutes. Greater than 50% of time was spent in counseling and coordination of care.  Follow-Up Instructions: No follow-ups on file.   Ellen Henri, CMA  Note - This record has been created using  Animal nutritionist.  Chart creation errors have been sought, but may not always  have been located. Such creation errors do not reflect on  the standard of medical care.

## 2021-11-19 ENCOUNTER — Other Ambulatory Visit: Payer: Self-pay | Admitting: Rheumatology

## 2021-11-19 MED ORDER — METHOTREXATE SODIUM CHEMO INJECTION 250 MG/10ML
20.0000 mg | INTRAMUSCULAR | 0 refills | Status: DC
Start: 2021-11-19 — End: 2022-01-27

## 2021-11-19 NOTE — Telephone Encounter (Signed)
Next Visit: 12/01/2021  Last Visit: 06/30/2021  Labs: 08/04/2021 Glucose is 123. Rest of CMP WNL.  CBC Wnl.   Current Dose per office note 06/30/2021: Methotrexate 0.8 ML subcutaneous injections every week   DX: Rheumatoid arthritis with rheumatoid factor of multiple sites without organ or systems involvement   Last Fill: 05/17/2021  Patient to update labs at upcoming appointment on 12/01/2021  Okay to refill MTX?

## 2021-11-29 NOTE — Progress Notes (Unsigned)
Office Visit Note  Patient: Debbie Williams             Date of Birth: 07/20/1965           MRN: [id number]             PCP: Harrison Mons, [place] Referring: Harrison Mons, [place] Visit Date: 12/13/2021 Occupation: '@GUAROCC' @  Subjective:  Intermittent left knee pain  History of Present Illness: Debbie Williams is a 56 y.o. female with history of seropositive rheumatoid arthritis and osteoarthritis.  She remains on Methotrexate 0.8 ML subcutaneous injections every week and folic acid 2 mg by mouth daily.  She continues to tolerate methotrexate without any side effects or injection site reactions.  She has not missed any doses of methotrexate recently.  She denies any signs or symptoms of a rheumatoid arthritis flare.  She has occasional discomfort in the left knee joint but denies any joint swelling.  She has been walking for exercise without difficulty.  She denies any mechanical symptoms.  She denies any nocturnal pain.  She denies any discomfort in her right knee joint.  She states overall she noticed improvement after the most recent round of Visco gel injections in May/June 2023.  She is not ready to reapply for Visco gel injections at this time.  She denies any other joint pain or joint swelling at this time.  She has been taking Aleve very sparingly for symptomatic relief. She denies any new medical conditions.  She has not had any recent or recurrent infections.   Activities of Daily Living:  Patient reports morning stiffness for 0 minutes.   Patient Denies nocturnal pain.  Difficulty dressing/grooming: Denies Difficulty climbing stairs: Denies Difficulty getting out of chair: Denies Difficulty using hands for taps, buttons, cutlery, and/or writing: Denies  Review of Systems  Constitutional:  Positive for fatigue.  HENT:  Negative for mouth sores and mouth dryness.   Eyes:  Negative for dryness.  Respiratory:  Negative for shortness of breath.   Cardiovascular:   Positive for palpitations. Negative for chest pain.  Gastrointestinal:  Negative for blood in stool, constipation and diarrhea.  Endocrine: Negative for increased urination.  Genitourinary:  Negative for involuntary urination.  Musculoskeletal:  Positive for joint pain and joint pain. Negative for gait problem, joint swelling, myalgias, muscle weakness, morning stiffness, muscle tenderness and myalgias.  Skin:  Negative for color change, rash, hair loss and sensitivity to sunlight.  Allergic/Immunologic: Negative for susceptible to infections.  Neurological:  Negative for dizziness and headaches.  Hematological:  Negative for swollen glands.  Psychiatric/Behavioral:  Negative for depressed mood and sleep disturbance. The patient is nervous/anxious.     PMFS History:  Patient Active Problem List   Diagnosis Date Noted   High risk medication use 03/21/2016   Anxiety 03/21/2016   Elevated blood pressure 10/12/2014   Allergic rhinitis due to pollen 10/12/2014   Rheumatoid arthritis with rheumatoid factor of multiple sites without organ or systems involvement ([place]) 09/18/2014   GAD (generalized anxiety disorder) 09/18/2014   Left knee pain 09/18/2014   Hyperglycemia 09/18/2014   BMI 36.0-36.9,adult 09/18/2014    Past Medical History:  Diagnosis Date   Anxiety    Arthritis    Rheumatoid   Depression    Diabetes mellitus without complication ([place])     Family History  Problem Relation Age of Onset   Hypertension Mother    Arthritis Mother    Diabetes Maternal Grandmother    Heart disease Maternal Grandmother  Arthritis Maternal Grandmother    Past Surgical History:  Procedure Laterality Date   INTRAUTERINE DEVICE INSERTION  09/2012   Mirena   Social History   Social History Narrative   Separated from her husband, though they share a home for now.   Their daughter lives with them.   Immunization History  Administered Date(s) Administered   Influenza,inj,Quad PF,6+ Mos  02/10/2015   Moderna Sars-Covid-2 Vaccination 04/04/2020   PFIZER Comirnaty(Gray Top)Covid-19 Tri-Sucrose Vaccine 06/26/2019, 07/24/2019   Pneumococcal Polysaccharide-23 09/18/2014   Tdap 09/18/2014   Zoster, Live 01/14/2015     Objective: Vital Signs: BP (!) 159/82 (BP Location: Left Arm, Patient Position: Sitting, Cuff Size: Large)   Pulse (!) 101   Resp 18   Ht 5' 6.5" (1.689 m)   Wt 219 lb 9.6 oz (99.6 kg)   LMP 09/01/2011   BMI 34.91 kg/m    Physical Exam Vitals and nursing note reviewed.  Constitutional:      Appearance: She is well-developed.  HENT:     Head: Normocephalic and atraumatic.  Eyes:     Conjunctiva/sclera: Conjunctivae normal.  Cardiovascular:     Rate and Rhythm: Normal rate and regular rhythm.     Heart sounds: Normal heart sounds.  Pulmonary:     Effort: Pulmonary effort is normal.     Breath sounds: Normal breath sounds.  Abdominal:     General: Bowel sounds are normal.     Palpations: Abdomen is soft.  Musculoskeletal:     Cervical back: Normal range of motion.  Skin:    General: Skin is warm and dry.     Capillary Refill: Capillary refill takes less than 2 seconds.  Neurological:     Mental Status: She is alert and oriented to person, place, and time.  Psychiatric:        Behavior: Behavior normal.      Musculoskeletal Exam: C-spine has good range of motion with no discomfort.  No midline spinal tenderness or SI joint tenderness.  Shoulder joints, elbow joints, wrist joints, MCPs, PIPs, DIPs have good range of motion with no synovitis.  Complete fist formation bilaterally.  Hip joints have good range of motion with no groin pain.  Knee joints have good range of motion with no warmth or effusion.  Ankle joints have good range of motion with no tenderness or joint swelling.  No tenderness or synovitis over MTP joints.  CDAI Exam: CDAI Score: 0.6  Patient Global: 5 mm; Provider Global: 1 mm Swollen: 0 ; Tender: 0  Joint Exam 12/13/2021    No joint exam has been documented for this visit   There is currently no information documented on the homunculus. Go to the Rheumatology activity and complete the homunculus joint exam.  Investigation: No additional findings.  Imaging: No results found.  Recent Labs: Lab Results  Component Value Date   WBC 6.2 08/04/2021   HGB 12.7 08/04/2021   PLT 376 08/04/2021   NA 139 08/04/2021   K 4.2 08/04/2021   CL 105 08/04/2021   CO2 23 08/04/2021   GLUCOSE 123 (H) 08/04/2021   BUN 18 08/04/2021   CREATININE 0.85 08/04/2021   BILITOT 0.8 08/04/2021   ALKPHOS 56 08/18/2016   AST 20 08/04/2021   ALT 22 08/04/2021   PROT 8.0 08/04/2021   ALBUMIN 4.3 08/18/2016   CALCIUM 9.9 08/04/2021   GFRAA 92 04/08/2020    Speciality Comments: No specialty comments available.  Procedures:  No procedures performed Allergies: Grass extracts Roosvelt Maser  pollens] and Pollen extract   Assessment / Plan:     Visit Diagnoses: Rheumatoid arthritis with rheumatoid factor of multiple sites without organ or systems involvement (HCC) - Positive RF, positive anti-CCP, elevated ESR, erosive disease: She has no joint tenderness or synovitis examination today.  She has not had any signs or symptoms of a rheumatoid arthritis flare.  She has clinically been doing well on methotrexate 0.8 mL subcu injections once weekly and folic acid 2 mg daily.  She continues to tolerate methotrexate without any side effects or injection site reactions.  She has not had any recent or recurrent infections.  She has not been experiencing nocturnal pain or morning stiffness.  No difficulty with ADLs.  She has been walking on a regular basis for exercise.  She has occasional discomfort in the left knee joint but has not noticed any joint swelling.  She takes Aleve very sparingly for pain relief.  She will remain on methotrexate as monotherapy.  She was advised to notify us if she develops increased joint pain or joint swelling.  She  will follow-up in the office in 5 months or sooner if needed.  High risk medication use - Methotrexate 0.8 ML subcutaneous injections every week and folic acid 2 mg by mouth daily.  - Plan: CBC with Differential/Platelet, COMPLETE METABOLIC PANEL WITH GFR CBC and CMP updated on 08/04/21.  Patient would like to have updated lab work next week.  Future orders for CBC and CMP were placed today.  Her next lab work will be due in November and every 3 months.  Standing orders for CBC and CMP remain in place.  Discussed the importance of having lab work every 3 months to monitor for drug toxicity. No recent or recurrent infections.  Discussed the importance of holding methotrexate if she develops signs or symptoms of an infection and to resume once the infection has completely cleared.   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 s/p orthovisc 5/23-6/23.  Left knee joint has good range of motion on examination today with no warmth or effusion.  She continues to experience intermittent discomfort in the left knee joint.  She is not experiencing mechanical symptoms or joint swelling.  She has not had any nocturnal pain or morning stiffness.  She takes Aleve very sparingly for pain relief.  She has not been using any other over-the-counter products for symptomatic relief.  She will notify us when she would like for [place] to reapply for Visco gel injections in the future.  Elevated LFTs -Patient plans on returning next week for updated lab work.  Future order for CMP was placed today.  Plan: COMPLETE METABOLIC PANEL WITH GFR  Other medical conditions are listed as follows:  Hyperglycemia  History of anxiety  Orders: Orders Placed This Encounter  Procedures   CBC with Differential/Platelet   COMPLETE METABOLIC PANEL WITH GFR   No orders of the defined types were placed in this  encounter.     Follow-Up Instructions: Return in about 5 months (around 05/15/2022) for Rheumatoid arthritis, Osteoarthritis.   Ofilia Neas, PA-C  Note - This record has been created using Dragon software.  Chart creation errors have been sought, but may not always  have been located. Such creation errors do not reflect on  the standard of medical care.

## 2021-12-01 ENCOUNTER — Ambulatory Visit: Payer: Medicare PPO | Admitting: Rheumatology

## 2021-12-01 DIAGNOSIS — Z8659 Personal history of other mental and behavioral disorders: Secondary | ICD-10-CM

## 2021-12-01 DIAGNOSIS — M1712 Unilateral primary osteoarthritis, left knee: Secondary | ICD-10-CM

## 2021-12-01 DIAGNOSIS — Z79899 Other long term (current) drug therapy: Secondary | ICD-10-CM

## 2021-12-01 DIAGNOSIS — R739 Hyperglycemia, unspecified: Secondary | ICD-10-CM

## 2021-12-01 DIAGNOSIS — M0579 Rheumatoid arthritis with rheumatoid factor of multiple sites without organ or systems involvement: Secondary | ICD-10-CM

## 2021-12-01 DIAGNOSIS — R7989 Other specified abnormal findings of blood chemistry: Secondary | ICD-10-CM

## 2021-12-13 ENCOUNTER — Encounter: Payer: Self-pay | Admitting: Physician Assistant

## 2021-12-13 ENCOUNTER — Ambulatory Visit: Payer: Medicare PPO | Attending: Rheumatology | Admitting: Physician Assistant

## 2021-12-13 VITALS — BP 159/82 | HR 101 | Resp 18 | Ht 66.5 in | Wt 219.6 lb

## 2021-12-13 DIAGNOSIS — M1712 Unilateral primary osteoarthritis, left knee: Secondary | ICD-10-CM

## 2021-12-13 DIAGNOSIS — M0579 Rheumatoid arthritis with rheumatoid factor of multiple sites without organ or systems involvement: Secondary | ICD-10-CM | POA: Diagnosis not present

## 2021-12-13 DIAGNOSIS — Z79899 Other long term (current) drug therapy: Secondary | ICD-10-CM

## 2021-12-13 DIAGNOSIS — Z8659 Personal history of other mental and behavioral disorders: Secondary | ICD-10-CM

## 2021-12-13 DIAGNOSIS — R7989 Other specified abnormal findings of blood chemistry: Secondary | ICD-10-CM | POA: Diagnosis not present

## 2021-12-13 DIAGNOSIS — R739 Hyperglycemia, unspecified: Secondary | ICD-10-CM

## 2021-12-13 NOTE — Patient Instructions (Signed)
Standing Labs We placed an order today for your standing lab work.   Please have your standing labs drawn in November and every 3 months   If possible, please have your labs drawn 2 weeks prior to your appointment so that the provider can discuss your results at your appointment.  Please note that you may see your imaging and lab results in MyChart before we have reviewed them. We may be awaiting multiple results to interpret others before contacting you. Please allow our office up to 72 hours to thoroughly review all of the results before contacting the office for clarification of your results.  We currently have open lab daily: Monday through Thursday from 1:30 PM-4:30 PM and Friday from 1:30 PM- 4:00 PM If possible, please come for your lab work on Monday, Thursday or Friday afternoons, as you may experience shorter wait times.   Effective February 16, 2022 the new lab hours will change to: Monday through Thursday from 1:30 PM-5:00 PM and Friday from 8:30 AM-12:00 PM If possible, please come for your lab work on Monday and Thursday afternoons, as you may experience shorter wait times.  Please be advised, all patients with office appointments requiring lab work will take precedent over walk-in lab work.    The office is located at [address], [address], [place],  [address] No appointment is necessary.   Labs are drawn by [facility]. Please bring your co-pay at the time of your lab draw.  You may receive a bill from [facility] for your lab work.  Please note if you are on Hydroxychloroquine and and an order has been placed for a Hydroxychloroquine level, you will need to have it drawn 4 hours or more after your last dose.  If you wish to have your labs drawn at another location, please call the office 24 hours in advance to send orders.  If you have any questions regarding directions or hours of operation,  please call [phone].   As a reminder, please drink plenty of water prior  to coming for your lab work. Thanks!  If you have signs or symptoms of an infection or start antibiotics: First, call your PCP for workup of your infection. Hold your medication through the infection, until you complete your antibiotics, and until symptoms resolve if you take the following: Injectable medication (Actemra, Benlysta, Cimzia, Cosentyx, Enbrel, Humira, Kevzara, Orencia, Remicade, Simponi, Stelara, Taltz, Tremfya) Methotrexate Leflunomide (Arava) Mycophenolate (Cellcept) Xeljanz, Olumiant, or Rinvoq   

## 2022-01-26 ENCOUNTER — Other Ambulatory Visit: Payer: Self-pay

## 2022-01-26 DIAGNOSIS — Z79899 Other long term (current) drug therapy: Secondary | ICD-10-CM

## 2022-01-26 DIAGNOSIS — R7989 Other specified abnormal findings of blood chemistry: Secondary | ICD-10-CM

## 2022-01-27 ENCOUNTER — Other Ambulatory Visit: Payer: Self-pay | Admitting: Rheumatology

## 2022-01-27 ENCOUNTER — Other Ambulatory Visit: Payer: Self-pay | Admitting: Physician Assistant

## 2022-01-27 ENCOUNTER — Encounter: Payer: Self-pay | Admitting: Rheumatology

## 2022-01-27 LAB — COMPLETE METABOLIC PANEL WITH GFR
AG Ratio: 1.3 (calc) (ref 1.0–2.5)
ALT: 23 U/L (ref 6–29)
AST: 21 U/L (ref 10–35)
Albumin: 4.7 g/dL (ref 3.6–5.1)
Alkaline phosphatase (APISO): 85 U/L (ref 37–153)
BUN: 15 mg/dL (ref 7–25)
CO2: 26 mmol/L (ref 20–32)
Calcium: 10.4 mg/dL (ref 8.6–10.4)
Chloride: 104 mmol/L (ref 98–110)
Creat: 0.81 mg/dL (ref 0.50–1.03)
Globulin: 3.6 g/dL (calc) (ref 1.9–3.7)
Glucose, Bld: 97 mg/dL (ref 65–99)
Potassium: 4.2 mmol/L (ref 3.5–5.3)
Sodium: 140 mmol/L (ref 135–146)
Total Bilirubin: 0.6 mg/dL (ref 0.2–1.2)
Total Protein: 8.3 g/dL — ABNORMAL HIGH (ref 6.1–8.1)
eGFR: 85 mL/min/{1.73_m2} (ref >=60)

## 2022-01-27 LAB — CBC WITH DIFFERENTIAL/PLATELET
Absolute Monocytes: 436 cells/uL (ref 200–950)
Basophils Absolute: 40 cells/uL (ref 0–200)
Basophils Relative: 0.6 %
Eosinophils Absolute: 188 cells/uL (ref 15–500)
Eosinophils Relative: 2.8 %
HCT: 38.3 % (ref 35.0–45.0)
Hemoglobin: 13 g/dL (ref 11.7–15.5)
Lymphs Abs: 3598 cells/uL (ref 850–3900)
MCH: 28.4 pg (ref 27.0–33.0)
MCHC: 33.9 g/dL (ref 32.0–36.0)
MCV: 83.8 fL (ref 80.0–100.0)
MPV: 11.6 fL (ref 7.5–12.5)
Monocytes Relative: 6.5 %
Neutro Abs: 2439 cells/uL (ref 1500–7800)
Neutrophils Relative %: 36.4 %
Platelets: 376 10*3/uL (ref 140–400)
RBC: 4.57 10*6/uL (ref 3.80–5.10)
RDW: 12.4 % (ref 11.0–15.0)
Total Lymphocyte: 53.7 %
WBC: 6.7 10*3/uL (ref 3.8–10.8)

## 2022-01-27 MED ORDER — METHOTREXATE SODIUM CHEMO INJECTION 250 MG/10ML
20.0000 mg | INTRAMUSCULAR | 0 refills | Status: DC
Start: 2022-01-27 — End: 2022-02-11

## 2022-01-27 NOTE — Telephone Encounter (Signed)
Next Visit: 05/17/2022   Last Visit: 12/13/2021   Last Fill: 11/19/2021   DX:  Rheumatoid arthritis with rheumatoid factor of multiple sites without organ or systems involvement    Current Dose per office note 12/13/2021: Methotrexate 0.8 ML subcutaneous injections every week    Labs: 01/26/2022 Total protein is borderline elevated-8.3. rest of CMP WNL.  CBC WNL.  We will continue to monitor. No medication changes recommended at this time.   Okay to refill MTX?  

## 2022-01-27 NOTE — Progress Notes (Signed)
Total protein is borderline elevated-8.3. rest of CMP WNL.  CBC WNL.  We will continue to monitor. No medication changes recommended at this time.

## 2022-01-28 ENCOUNTER — Encounter: Payer: Self-pay | Admitting: Rheumatology

## 2022-01-28 MED ORDER — METHOTREXATE SODIUM 2.5 MG PO TABS: 20.0000 mg | ORAL_TABLET | ORAL | 0 refills | Status: DC

## 2022-01-28 NOTE — Telephone Encounter (Signed)
yes

## 2022-02-02 ENCOUNTER — Other Ambulatory Visit: Payer: Self-pay | Admitting: Physician Assistant

## 2022-02-03 ENCOUNTER — Other Ambulatory Visit: Payer: Self-pay | Admitting: Physician Assistant

## 2022-02-04 ENCOUNTER — Encounter: Payer: Self-pay | Admitting: Rheumatology

## 2022-02-08 ENCOUNTER — Other Ambulatory Visit: Payer: Self-pay | Admitting: Physician Assistant

## 2022-02-10 ENCOUNTER — Encounter: Payer: Self-pay | Admitting: Rheumatology

## 2022-02-11 ENCOUNTER — Other Ambulatory Visit (HOSPITAL_BASED_OUTPATIENT_CLINIC_OR_DEPARTMENT_OTHER): Payer: Self-pay

## 2022-02-11 MED ORDER — METHOTREXATE SODIUM CHEMO INJECTION 50 MG/2ML
20.0000 mg | INTRAMUSCULAR | 0 refills | Status: DC
  Filled 2022-02-11: qty 4, 35d supply, fill #0
  Filled 2022-03-15: qty 4, 35d supply, fill #1
  Filled 2022-05-10: qty 4, 35d supply, fill #2

## 2022-02-11 NOTE — Telephone Encounter (Signed)
Next Visit: 05/17/2022   Last Visit: 12/13/2021   Last Fill: 11/19/2021   DX:  Rheumatoid arthritis with rheumatoid factor of multiple sites without organ or systems involvement    Current Dose per office note 12/13/2021: Methotrexate 0.8 ML subcutaneous injections every week    Labs: 01/26/2022 Total protein is borderline elevated-8.3. rest of CMP WNL.  CBC WNL.  We will continue to monitor. No medication changes recommended at this time.   Okay to refill MTX?

## 2022-02-20 ENCOUNTER — Other Ambulatory Visit: Payer: Self-pay | Admitting: Physician Assistant

## 2022-03-15 ENCOUNTER — Other Ambulatory Visit (HOSPITAL_BASED_OUTPATIENT_CLINIC_OR_DEPARTMENT_OTHER): Payer: Self-pay

## 2022-05-03 NOTE — Progress Notes (Deleted)
Office Visit Note  Patient: Debbie Williams             Date of Birth: 1966-03-19           MRN: [id number]             PCP: Harrison Mons, [place] Referring: Harrison Mons, [place] Visit Date: 05/17/2022 Occupation: '@GUAROCC'$ @  Subjective:  No chief complaint on file.   History of Present Illness: Debbie Williams is a 57 y.o. female ***     Activities of Daily Living:  Patient reports morning stiffness for *** {minute/hour:19697}.   Patient {ACTIONS;DENIES/REPORTS:21021675::"Denies"} nocturnal pain.  Difficulty dressing/grooming: {ACTIONS;DENIES/REPORTS:21021675::"Denies"} Difficulty climbing stairs: {ACTIONS;DENIES/REPORTS:21021675::"Denies"} Difficulty getting out of chair: {ACTIONS;DENIES/REPORTS:21021675::"Denies"} Difficulty using hands for taps, buttons, cutlery, and/or writing: {ACTIONS;DENIES/REPORTS:21021675::"Denies"}  No Rheumatology ROS completed.   PMFS History:  Patient Active Problem List   Diagnosis Date Noted   High risk medication use 03/21/2016   Anxiety 03/21/2016   Elevated blood pressure 10/12/2014   Allergic rhinitis due to pollen 10/12/2014   Rheumatoid arthritis with rheumatoid factor of multiple sites without organ or systems involvement ([place]) 09/18/2014   GAD (generalized anxiety disorder) 09/18/2014   Left knee pain 09/18/2014   Hyperglycemia 09/18/2014   BMI 36.0-36.9,adult 09/18/2014    Past Medical History:  Diagnosis Date   Anxiety    Arthritis    Rheumatoid   Depression    Diabetes mellitus without complication ([place])     Family History  Problem Relation Age of Onset   Hypertension Mother    Arthritis Mother    Diabetes Maternal Grandmother    Heart disease Maternal Grandmother    Arthritis Maternal Grandmother    Past Surgical History:  Procedure Laterality Date   INTRAUTERINE DEVICE INSERTION  09/2012   Mirena   Social History   Social History Narrative   Separated from her husband, though they share a  home for now.   Their daughter lives with them.   Immunization History  Administered Date(s) Administered   Influenza,inj,Quad PF,6+ Mos 02/10/2015   Moderna Sars-Covid-2 Vaccination 04/04/2020   PFIZER Comirnaty(Gray Top)Covid-19 Tri-Sucrose Vaccine 06/26/2019, 07/24/2019   Pneumococcal Polysaccharide-23 09/18/2014   Tdap 09/18/2014   Zoster, Live 01/14/2015     Objective: Vital Signs: LMP 09/01/2011    Physical Exam   Musculoskeletal Exam: ***  CDAI Exam: CDAI Score: -- Patient Global: --; Provider Global: -- Swollen: --; Tender: -- Joint Exam 05/17/2022   No joint exam has been documented for this visit   There is currently no information documented on the homunculus. Go to the Rheumatology activity and complete the homunculus joint exam.  Investigation: No additional findings.  Imaging: No results found.  Recent Labs: Lab Results  Component Value Date   WBC 6.7 01/26/2022   HGB 13.0 01/26/2022   PLT 376 01/26/2022   NA 140 01/26/2022   K 4.2 01/26/2022   CL 104 01/26/2022   CO2 26 01/26/2022   GLUCOSE 97 01/26/2022   BUN 15 01/26/2022   CREATININE 0.81 01/26/2022   BILITOT 0.6 01/26/2022   ALKPHOS 56 08/18/2016   AST 21 01/26/2022   ALT 23 01/26/2022   PROT 8.3 (H) 01/26/2022   ALBUMIN 4.3 08/18/2016   CALCIUM 10.4 01/26/2022   GFRAA 92 04/08/2020    Speciality Comments: No specialty comments available.  Procedures:  No procedures performed Allergies: Grass extracts [gramineae pollens] and Pollen extract   Assessment / Plan:     Visit Diagnoses: No diagnosis found.  Orders: No orders of the  defined types were placed in this encounter.  No orders of the defined types were placed in this encounter.   Face-to-face time spent with patient was *** minutes. Greater than 50% of time was spent in counseling and coordination of care.  Follow-Up Instructions: No follow-ups on file.   Earnestine Mealing, CMA  Note - This record has been created  using Editor, commissioning.  Chart creation errors have been sought, but may not always  have been located. Such creation errors do not reflect on  the standard of medical care.

## 2022-05-10 ENCOUNTER — Encounter: Payer: Self-pay | Admitting: Rheumatology

## 2022-05-10 ENCOUNTER — Other Ambulatory Visit (HOSPITAL_BASED_OUTPATIENT_CLINIC_OR_DEPARTMENT_OTHER): Payer: Self-pay

## 2022-05-10 ENCOUNTER — Other Ambulatory Visit: Payer: Self-pay | Admitting: Rheumatology

## 2022-05-10 MED ORDER — "BD TB SYRINGE 27G X 1/2"" 1 ML MISC": 3 refills | Status: DC

## 2022-05-10 MED ORDER — METHOTREXATE SODIUM CHEMO INJECTION 50 MG/2ML
20.0000 mg | INTRAMUSCULAR | 0 refills | Status: DC
  Filled 2022-05-10: qty 4, 35d supply, fill #0

## 2022-05-10 NOTE — Telephone Encounter (Signed)
Next Visit: 05/17/2022   Last Visit: 12/13/2021   Last Fill: 02/11/2022  DX:  Rheumatoid arthritis with rheumatoid factor of multiple sites without organ or systems involvement   Current Dose per office note 12/13/2021: Methotrexate 0.8 ML subcutaneous injections every week    Labs: 01/26/2022 Total protein is borderline elevated-8.3. rest of CMP WNL.  CBC WNL.  We will continue to monitor. No medication changes recommended at this time.  Sent message via my chart to advise patient she is due to update labs.    Okay to refill MTX?

## 2022-05-10 NOTE — Telephone Encounter (Signed)
Next Visit: 05/17/2022   Last Visit: 12/13/2021   Last Fill: 01/04/2021  DX:  Rheumatoid arthritis with rheumatoid factor of multiple sites without organ or systems involvement    Okay to refill syringes?

## 2022-05-11 ENCOUNTER — Other Ambulatory Visit (HOSPITAL_BASED_OUTPATIENT_CLINIC_OR_DEPARTMENT_OTHER): Payer: Self-pay

## 2022-05-13 ENCOUNTER — Other Ambulatory Visit (HOSPITAL_BASED_OUTPATIENT_CLINIC_OR_DEPARTMENT_OTHER): Payer: Self-pay

## 2022-05-13 ENCOUNTER — Encounter: Payer: Self-pay | Admitting: Rheumatology

## 2022-05-13 MED ORDER — "BD TB SYRINGE 27G X 1/2"" 1 ML MISC"
3 refills | Status: DC
  Filled 2022-05-13: qty 9, 63d supply, fill #0
  Filled 2022-09-01: qty 9, 63d supply, fill #1
  Filled 2023-01-11: qty 9, 63d supply, fill #2
  Filled 2023-04-24: qty 9, 63d supply, fill #3

## 2022-05-13 NOTE — Telephone Encounter (Signed)
Next Visit: 05/17/2022  Last Visit: 12/13/2021  Last Fill: 05/10/2022 but patient's pharmacy is out of syringes.   DX: Rheumatoid arthritis with rheumatoid factor of multiple sites without organ or systems involvement   Okay to refill syringes?

## 2022-05-17 ENCOUNTER — Ambulatory Visit: Payer: Medicare PPO | Admitting: Rheumatology

## 2022-05-17 DIAGNOSIS — Z8659 Personal history of other mental and behavioral disorders: Secondary | ICD-10-CM

## 2022-05-17 DIAGNOSIS — Z79899 Other long term (current) drug therapy: Secondary | ICD-10-CM

## 2022-05-17 DIAGNOSIS — M0579 Rheumatoid arthritis with rheumatoid factor of multiple sites without organ or systems involvement: Secondary | ICD-10-CM

## 2022-05-17 DIAGNOSIS — R739 Hyperglycemia, unspecified: Secondary | ICD-10-CM

## 2022-05-17 DIAGNOSIS — M1712 Unilateral primary osteoarthritis, left knee: Secondary | ICD-10-CM

## 2022-05-17 DIAGNOSIS — R7989 Other specified abnormal findings of blood chemistry: Secondary | ICD-10-CM

## 2022-05-24 NOTE — Progress Notes (Deleted)
Office Visit Note  Patient: Debbie Williams             Date of Birth: [birth year]           MRN: [id number]             PCP: Harrison Mons, PA Referring: Harrison Mons, PA Visit Date: 06/07/2022 Occupation: @GUAROCC$ @  Subjective:    History of Present Illness: Debbie Williams is a 57 y.o. female with history of seropositive rheumatoid arthritis and osteoarthritis.  She remains on Methotrexate 0.8 ML subcutaneous injections every week and folic acid 2 mg by mouth daily.   CBC and CMP updated on 01/26/22. Orders for CBC and CMP released today.  Her next lab work will be due in May and every 3 months.   Discussed the importance of holding methotrexate if she develops signs or symptoms of an infection and to resume once the infection has completely cleared.    Activities of Daily Living:  Patient reports morning stiffness for *** {minute/hour:19697}.   Patient {ACTIONS;DENIES/REPORTS:21021675::"Denies"} nocturnal pain.  Difficulty dressing/grooming: {ACTIONS;DENIES/REPORTS:21021675::"Denies"} Difficulty climbing stairs: {ACTIONS;DENIES/REPORTS:21021675::"Denies"} Difficulty getting out of chair: {ACTIONS;DENIES/REPORTS:21021675::"Denies"} Difficulty using hands for taps, buttons, cutlery, and/or writing: {ACTIONS;DENIES/REPORTS:21021675::"Denies"}  No Rheumatology ROS completed.   PMFS History:  Patient Active Problem List   Diagnosis Date Noted   High risk medication use 03/21/2016   Anxiety 03/21/2016   Elevated blood pressure 10/12/2014   Allergic rhinitis due to pollen 10/12/2014   Rheumatoid arthritis with rheumatoid factor of multiple sites without organ or systems involvement ([place]) 09/18/2014   GAD (generalized anxiety disorder) 09/18/2014   Left knee pain 09/18/2014   Hyperglycemia 09/18/2014   BMI 36.0-36.9,adult 09/18/2014    Past Medical History:  Diagnosis Date   Anxiety    Arthritis    Rheumatoid   Depression    Diabetes mellitus  without complication ([place])     Family History  Problem Relation Age of Onset   Hypertension Mother    Arthritis Mother    Diabetes Maternal Grandmother    Heart disease Maternal Grandmother    Arthritis Maternal Grandmother    Past Surgical History:  Procedure Laterality Date   INTRAUTERINE DEVICE INSERTION  09/2012   Mirena   Social History   Social History Narrative   Separated from her husband, though they share a home for now.   Their daughter lives with them.   Immunization History  Administered Date(s) Administered   Influenza,inj,Quad PF,6+ Mos 02/10/2015   Moderna Sars-Covid-2 Vaccination 04/04/2020   PFIZER Comirnaty(Gray Top)Covid-19 Tri-Sucrose Vaccine 06/26/2019, 07/24/2019   Pneumococcal Polysaccharide-23 09/18/2014   Tdap 09/18/2014   Zoster, Live 01/14/2015     Objective: Vital Signs: LMP 09/01/2011    Physical Exam Vitals and nursing note reviewed.  Constitutional:      Appearance: She is well-developed.  HENT:     Head: Normocephalic and atraumatic.  Eyes:     Conjunctiva/sclera: Conjunctivae normal.  Cardiovascular:     Rate and Rhythm: Normal rate and regular rhythm.     Heart sounds: Normal heart sounds.  Pulmonary:     Effort: Pulmonary effort is normal.     Breath sounds: Normal breath sounds.  Abdominal:     General: Bowel sounds are normal.     Palpations: Abdomen is soft.  Musculoskeletal:     Cervical back: Normal range of motion.  Skin:    General: Skin is warm and dry.     Capillary Refill: Capillary refill takes less than 2 seconds.  Neurological:  Mental Status: She is alert and oriented to person, place, and time.  Psychiatric:        Behavior: Behavior normal.      Musculoskeletal Exam: ***  CDAI Exam: CDAI Score: -- Patient Global: --; Provider Global: -- Swollen: --; Tender: -- Joint Exam 06/07/2022   No joint exam has been documented for this visit   There is currently no information documented on the  homunculus. Go to the Rheumatology activity and complete the homunculus joint exam.  Investigation: No additional findings.  Imaging: No results found.  Recent Labs: Lab Results  Component Value Date   WBC 6.7 01/26/2022   HGB 13.0 01/26/2022   PLT 376 01/26/2022   NA 140 01/26/2022   K 4.2 01/26/2022   CL 104 01/26/2022   CO2 26 01/26/2022   GLUCOSE 97 01/26/2022   BUN 15 01/26/2022   CREATININE 0.81 01/26/2022   BILITOT 0.6 01/26/2022   ALKPHOS 56 08/18/2016   AST 21 01/26/2022   ALT 23 01/26/2022   PROT 8.3 (H) 01/26/2022   ALBUMIN 4.3 08/18/2016   CALCIUM 10.4 01/26/2022   GFRAA 92 04/08/2020    Speciality Comments: No specialty comments available.  Procedures:  No procedures performed Allergies: Grass extracts [gramineae pollens] and Pollen extract   Assessment / Plan:     Visit Diagnoses: No diagnosis found.  Orders: No orders of the defined types were placed in this encounter.  No orders of the defined types were placed in this encounter.   Face-to-face time spent with patient was *** minutes. Greater than 50% of time was spent in counseling and coordination of care.  Follow-Up Instructions: No follow-ups on file.   Earnestine Mealing, CMA  Note - This record has been created using Editor, commissioning.  Chart creation errors have been sought, but may not always  have been located. Such creation errors do not reflect on  the standard of medical care.

## 2022-06-07 ENCOUNTER — Ambulatory Visit: Payer: Medicare PPO | Admitting: Physician Assistant

## 2022-06-07 DIAGNOSIS — M0579 Rheumatoid arthritis with rheumatoid factor of multiple sites without organ or systems involvement: Secondary | ICD-10-CM

## 2022-06-07 DIAGNOSIS — R7989 Other specified abnormal findings of blood chemistry: Secondary | ICD-10-CM

## 2022-06-07 DIAGNOSIS — Z8659 Personal history of other mental and behavioral disorders: Secondary | ICD-10-CM

## 2022-06-07 DIAGNOSIS — Z79899 Other long term (current) drug therapy: Secondary | ICD-10-CM

## 2022-06-07 DIAGNOSIS — M1712 Unilateral primary osteoarthritis, left knee: Secondary | ICD-10-CM

## 2022-06-07 DIAGNOSIS — R739 Hyperglycemia, unspecified: Secondary | ICD-10-CM

## 2022-06-09 NOTE — Progress Notes (Unsigned)
Office Visit Note  Patient: Debbie Williams             Date of Birth: 1965/05/15           MRN: [id number]             PCP: Harrison Mons, PA Referring: Harrison Mons, PA Visit Date: 06/22/2022 Occupation: '@GUAROCC'$ @  Subjective:  Left knee pain   History of Present Illness: Debbie Williams is a 57 y.o. female with history of seropositive rheumatoid arthritis and osteoarthritis.  Patient remains on Methotrexate 0.8 ML subcutaneous injections every week and folic acid 2 mg by mouth daily.  She continues to tolerate methotrexate without any side effects or injection site reactions.  She has not missed any doses of methotrexate recently.  She has not had any recent or recurrent infections.  She denies any recent rheumatoid arthritis flares.  Patient underwent Visco gel injections for the left knee in May/June 2023.  She noticed a significant improvement in her discomfort and stiffness after undergoing Visco gel injections.  Her mobility is also improved.  She has been trying to walk 30 minutes every day for exercise.  Patient reports that yesterday she was at the [facility] and slipped when getting in the Jacuzzi tub.  She states that she had the lateral aspect of her left knee on the step and has had increased discomfort since then.  She denies any joint swelling or bruising.  She has not taken any over-the-counter products for pain relief.  She denies any other joint pain or joint swelling at this time.   Activities of Daily Living:  Patient reports morning stiffness for 0 minutes.   Patient Denies nocturnal pain.  Difficulty dressing/grooming: Denies Difficulty climbing stairs: Reports Difficulty getting out of chair: Denies Difficulty using hands for taps, buttons, cutlery, and/or writing: Denies  Review of Systems  Constitutional: Negative.  Negative for fatigue.  HENT: Negative.  Negative for mouth sores and mouth dryness.   Eyes: Negative.  Negative for dryness.   Respiratory: Negative.  Negative for shortness of breath.   Cardiovascular: Negative.  Negative for chest pain and palpitations.  Gastrointestinal: Negative.  Negative for blood in stool, constipation and diarrhea.  Endocrine: Negative.  Negative for increased urination.  Genitourinary: Negative.  Negative for involuntary urination.  Musculoskeletal:  Negative for joint pain, gait problem, joint pain, joint swelling, myalgias, muscle weakness, morning stiffness, muscle tenderness and myalgias.  Skin: Negative.  Negative for color change, rash, hair loss and sensitivity to sunlight.  Allergic/Immunologic: Negative.  Negative for susceptible to infections.  Neurological: Negative.  Negative for dizziness and headaches.  Hematological: Negative.  Negative for swollen glands.  Psychiatric/Behavioral: Negative.  Negative for depressed mood and sleep disturbance. The patient is not nervous/anxious.     PMFS History:  Patient Active Problem List   Diagnosis Date Noted   High risk medication use 03/21/2016   Anxiety 03/21/2016   Elevated blood pressure 10/12/2014   Allergic rhinitis due to pollen 10/12/2014   Rheumatoid arthritis with rheumatoid factor of multiple sites without organ or systems involvement ([place]) 09/18/2014   GAD (generalized anxiety disorder) 09/18/2014   Left knee pain 09/18/2014   Hyperglycemia 09/18/2014   BMI 36.0-36.9,adult 09/18/2014    Past Medical History:  Diagnosis Date   Anxiety    Arthritis    Rheumatoid   Depression    Diabetes mellitus without complication ([facility])     Family History  Problem Relation Age of Onset   Hypertension Mother  Arthritis Mother    Diabetes Maternal Grandmother    Heart disease Maternal Grandmother    Arthritis Maternal Grandmother    Past Surgical History:  Procedure Laterality Date   INTRAUTERINE DEVICE INSERTION  09/2012   Mirena   Social History   Social History Narrative   Separated from her husband, though they  share a home for now.   Their daughter lives with them.   Immunization History  Administered Date(s) Administered   Influenza,inj,Quad PF,6+ Mos 02/10/2015   Moderna Sars-Covid-2 Vaccination 04/04/2020   PFIZER Comirnaty(Gray Top)Covid-19 Tri-Sucrose Vaccine 06/26/2019, 07/24/2019   Pneumococcal Polysaccharide-23 09/18/2014   Tdap 09/18/2014   Zoster, Live 01/14/2015     Objective: Vital Signs: BP (!) 154/82 (BP Location: Right Arm, Patient Position: Sitting, Cuff Size: Large)   Pulse 99   Resp 16   Ht 5' 6.5" (1.689 m)   Wt 219 lb (99.3 kg)   LMP 09/01/2011   BMI 34.82 kg/m    Physical Exam Vitals and nursing note reviewed.  Constitutional:      Appearance: She is well-developed.  HENT:     Head: Normocephalic and atraumatic.  Eyes:     Conjunctiva/sclera: Conjunctivae normal.  Cardiovascular:     Rate and Rhythm: Normal rate and regular rhythm.     Heart sounds: Normal heart sounds.  Pulmonary:     Effort: Pulmonary effort is normal.     Breath sounds: Normal breath sounds.  Abdominal:     General: Bowel sounds are normal.     Palpations: Abdomen is soft.  Musculoskeletal:     Cervical back: Normal range of motion.  Skin:    General: Skin is warm and dry.     Capillary Refill: Capillary refill takes less than 2 seconds.  Neurological:     Mental Status: She is alert and oriented to person, place, and time.  Psychiatric:        Behavior: Behavior normal.      Musculoskeletal Exam: C-spine, thoracic spine, lumbar spine have good range of motion.  No midline spinal tenderness.  Shoulder joints and elbow joints have good range of motion with no discomfort.  Limited flexion extension of both wrist joints but no active tenderness or synovitis.  No tenderness or synovitis over MCP joints.  Complete fist formation bilaterally.  Hip joints have good range of motion with no groin pain.  Both knee joints have good range of motion with no warmth or effusion.  Some tenderness  along the lateral aspect of the left knee.  Ankle joints have good range of motion with no tenderness or joint swelling.  No tenderness or synovitis over MTP joints.  CDAI Exam: CDAI Score: -- Patient Global: 4 mm; Provider Global: 4 mm Swollen: --; Tender: -- Joint Exam 06/22/2022   No joint exam has been documented for this visit   There is currently no information documented on the homunculus. Go to the Rheumatology activity and complete the homunculus joint exam.  Investigation: No additional findings.  Imaging: No results found.  Recent Labs: Lab Results  Component Value Date   WBC 6.7 01/26/2022   HGB 13.0 01/26/2022   PLT 376 01/26/2022   NA 140 01/26/2022   K 4.2 01/26/2022   CL 104 01/26/2022   CO2 26 01/26/2022   GLUCOSE 97 01/26/2022   BUN 15 01/26/2022   CREATININE 0.81 01/26/2022   BILITOT 0.6 01/26/2022   ALKPHOS 56 08/18/2016   AST 21 01/26/2022   ALT 23 01/26/2022  PROT 8.3 (H) 01/26/2022   ALBUMIN 4.3 08/18/2016   CALCIUM 10.4 01/26/2022   GFRAA 92 04/08/2020    Speciality Comments: No specialty comments available.  Procedures:  No procedures performed Allergies: Grass extracts [gramineae pollens] and Pollen extract   Assessment / Plan:     Visit Diagnoses: Rheumatoid arthritis with rheumatoid factor of multiple sites without organ or systems involvement ([place]): She has no synovitis on examination today.  She has not had any signs or symptoms of a rheumatoid arthritis flare.  She has clinically been doing well on methotrexate 0.8 mL sq injections once weekly along with folic acid 2 mg daily.  She is tolerating methotrexate without any side effects or injection site reactions.  She has not had any recent or recurrent infections. She has not been experiencing any morning stiffness or nocturnal pain.  No difficulty with ADLs.  She will remain on methotrexate as prescribed.  She was advised to notify us if she develops signs or symptoms of a flare.  She  will follow-up in the office in 5 months or sooner if needed.  High risk medication use - Methotrexate 0.8 ML subcutaneous injections every week and folic acid 2 mg by mouth daily.  CBC and CMP updated on 01/26/22.  She is overdue to update lab work.  Orders for CBC and CMP released today.  Her next lab work will be due in June and every 3 months.  No recent or recurrent infections.  Discussed the importance of holding methotrexate if she develops signs or symptoms of an infection and to resume once the infection has completely cleared.   - Plan: CBC with Differential/Platelet, COMPLETE METABOLIC PANEL WITH GFR  Primary osteoarthritis and rheumatoid arthritis of left knee -  X-rays of the left knee updated on 02/26/19: Severe osteoarthritis and moderate chondromalacia patella.  She underwent Visco gel injections in the left knee in May/June 2023.  She has noticed significant improvement in her left knee joint pain and stiffness since having Visco supplementation.  She has been able to increase her mobility and has been walking 30 minutes daily for exercise.  [place] she [place] and slipped when getting into the [facility] and hit the lateral aspect of her left knee on the step.  No erythema or ecchymosis was noted.  She has good range of motion of the left knee joint on examination today.  No warmth or effusion noted.  No instability noted.  She has not tried taking any anti-inflammatories.  Discussed the use of Voltaren gel, ice, elevation, and compression.  She was advised to notify us if her symptoms persist or worsen.  She was also advised to notify us when and if she would like to reapply for Visco gel injections in the future.  Elevated LFTs -AST and ALT were within normal limits on 01/26/2022.  CMP updated today.  Plan: COMPLETE METABOLIC PANEL WITH GFR  History of anxiety  Orders: Orders Placed This Encounter  Procedures   CBC with Differential/Platelet   COMPLETE METABOLIC PANEL  WITH GFR   No orders of the defined types were placed in this encounter.     Follow-Up Instructions: Return in about 5 months (around 11/22/2022) for Osteoarthritis, Rheumatoid arthritis.   Ofilia Neas, PA-C  Note - This record has been created using Dragon software.  Chart creation errors have been sought, but may not always  have been located. Such creation errors do not reflect on  the standard of medical care.

## 2022-06-22 ENCOUNTER — Encounter: Payer: Self-pay | Admitting: Physician Assistant

## 2022-06-22 ENCOUNTER — Ambulatory Visit: Payer: Medicare PPO | Attending: Rheumatology | Admitting: Physician Assistant

## 2022-06-22 VITALS — BP 154/82 | HR 99 | Resp 16 | Ht 66.5 in | Wt 219.0 lb

## 2022-06-22 DIAGNOSIS — Z79899 Other long term (current) drug therapy: Secondary | ICD-10-CM

## 2022-06-22 DIAGNOSIS — M0579 Rheumatoid arthritis with rheumatoid factor of multiple sites without organ or systems involvement: Secondary | ICD-10-CM | POA: Diagnosis not present

## 2022-06-22 DIAGNOSIS — R7989 Other specified abnormal findings of blood chemistry: Secondary | ICD-10-CM

## 2022-06-22 DIAGNOSIS — M1712 Unilateral primary osteoarthritis, left knee: Secondary | ICD-10-CM

## 2022-06-22 DIAGNOSIS — Z8659 Personal history of other mental and behavioral disorders: Secondary | ICD-10-CM

## 2022-06-22 LAB — CBC WITH DIFFERENTIAL/PLATELET
Absolute Monocytes: 510 cells/uL (ref 200–950)
Basophils Absolute: 50 cells/uL (ref 0–200)
Basophils Relative: 0.8 %
Eosinophils Absolute: 183 cells/uL (ref 15–500)
Eosinophils Relative: 2.9 %
HCT: 38.2 % (ref 35.0–45.0)
Hemoglobin: 12.8 g/dL (ref 11.7–15.5)
Lymphs Abs: 3112 cells/uL (ref 850–3900)
MCH: 27.7 pg (ref 27.0–33.0)
MCHC: 33.5 g/dL (ref 32.0–36.0)
MCV: 82.7 fL (ref 80.0–100.0)
MPV: 11.2 fL (ref 7.5–12.5)
Monocytes Relative: 8.1 %
Neutro Abs: 2444 cells/uL (ref 1500–7800)
Neutrophils Relative %: 38.8 %
Platelets: 426 10*3/uL — ABNORMAL HIGH (ref 140–400)
RBC: 4.62 10*6/uL (ref 3.80–5.10)
RDW: 12.7 % (ref 11.0–15.0)
Total Lymphocyte: 49.4 %
WBC: 6.3 10*3/uL (ref 3.8–10.8)

## 2022-06-22 LAB — COMPLETE METABOLIC PANEL WITH GFR
AG Ratio: 1.3 (calc) (ref 1.0–2.5)
ALT: 32 U/L — ABNORMAL HIGH (ref 6–29)
AST: 33 U/L (ref 10–35)
Albumin: 4.9 g/dL (ref 3.6–5.1)
Alkaline phosphatase (APISO): 79 U/L (ref 37–153)
BUN: 14 mg/dL (ref 7–25)
CO2: 24 mmol/L (ref 20–32)
Calcium: 10.3 mg/dL (ref 8.6–10.4)
Chloride: 105 mmol/L (ref 98–110)
Creat: 0.83 mg/dL (ref 0.50–1.03)
Globulin: 3.7 g/dL (calc) (ref 1.9–3.7)
Glucose, Bld: 126 mg/dL — ABNORMAL HIGH (ref 65–99)
Potassium: 4.2 mmol/L (ref 3.5–5.3)
Sodium: 140 mmol/L (ref 135–146)
Total Bilirubin: 0.9 mg/dL (ref 0.2–1.2)
Total Protein: 8.6 g/dL — ABNORMAL HIGH (ref 6.1–8.1)
eGFR: 82 mL/min/{1.73_m2} (ref >=60)

## 2022-06-22 NOTE — Patient Instructions (Signed)
Standing Labs We placed an order today for your standing lab work.   Please have your standing labs drawn in June and every 3 months   Please have your labs drawn 2 weeks prior to your appointment so that the provider can discuss your lab results at your appointment, if possible.  Please note that you may see your imaging and lab results in [place] before we have reviewed them. We will contact you once all results are reviewed. Please allow our office up to 72 hours to thoroughly review all of the results before contacting the office for clarification of your results.  WALK-IN LAB HOURS  Monday through Thursday from 8:00 am -12:30 pm and 1:00 pm-5:00 pm and Friday from 8:00 am-12:00 pm.  Patients with office visits requiring labs will be seen before walk-in labs.  You may encounter longer than normal wait times. Please allow additional time. Wait times may be shorter on  Monday and Thursday afternoons.  We do not book appointments for walk-in labs. We appreciate your patience and understanding with our staff.   Labs are drawn by [facility]. Please bring your co-pay at the time of your lab draw.  You may receive a bill from [place] for your lab work.  Please note if you are on Hydroxychloroquine and and an order has been placed for a Hydroxychloroquine level,  you will need to have it drawn 4 hours or more after your last dose.  If you wish to have your labs drawn at another location, please call the office 24 hours in advance so we can fax the orders.  The office is located at [address], [place], [place], [place] [address]   If you have any questions regarding directions or hours of operation,  please call [phone].   As a reminder, please drink plenty of water prior to coming for your lab work. Thanks!

## 2022-06-23 ENCOUNTER — Other Ambulatory Visit: Payer: Self-pay | Admitting: *Deleted

## 2022-06-23 NOTE — Progress Notes (Signed)
CBC and CMP are stable except ALT (liver function) is mildly elevated.  Patient was doing well at the last visit.  She may reduce the dose of methotrexate to 0.6 mL subcu weekly.

## 2022-06-24 MED ORDER — METHOTREXATE SODIUM CHEMO INJECTION 50 MG/2ML: 15.0000 mg | INTRAMUSCULAR | 0 refills | Status: DC

## 2022-07-22 ENCOUNTER — Other Ambulatory Visit (HOSPITAL_BASED_OUTPATIENT_CLINIC_OR_DEPARTMENT_OTHER): Payer: Self-pay

## 2022-07-22 ENCOUNTER — Other Ambulatory Visit: Payer: Self-pay | Admitting: *Deleted

## 2022-07-22 MED ORDER — METHOTREXATE SODIUM CHEMO INJECTION 50 MG/2ML
15.0000 mg | INTRAMUSCULAR | 0 refills | Status: DC
  Filled 2022-07-22: qty 8, 28d supply, fill #0

## 2022-07-22 NOTE — Telephone Encounter (Signed)
Last Fill: 05/10/2022  Labs: 06/22/2022 BC and CMP are stable except ALT (liver function) is mildly elevated.   Next Visit: 11/23/2022  Last Visit: 06/22/2022  DX: Rheumatoid arthritis with rheumatoid factor of multiple sites without organ or systems involvement   Current Dose per lab note 06/22/2022: reduce the dose of methotrexate to 0.6 mL subcu weekly.   Okay to refill Methotrexate?

## 2022-07-22 NOTE — Telephone Encounter (Signed)
From: Ladean Raya McGee-Alharazim To: Office of Gearldine [place], [place] Sent: 07/22/2022 10:22 AM EDT Subject: Medication Renewal Request  Refills have been requested for the following medications:   methotrexate 50 MG/2ML injection [place]]  Preferred pharmacy: [facility] [place] Delivery method: Pickup Preferred pick-up date and time: 07/25/2022 12:00 PM

## 2022-09-01 ENCOUNTER — Encounter: Payer: Self-pay | Admitting: Rheumatology

## 2022-09-01 ENCOUNTER — Telehealth: Payer: Self-pay | Admitting: Rheumatology

## 2022-09-01 NOTE — Telephone Encounter (Signed)
Please apply for left knee visco. Thank you

## 2022-09-01 NOTE — Telephone Encounter (Signed)
VOB submitted for Orthovisc, Left knee(s) BV pending 

## 2022-09-01 NOTE — Telephone Encounter (Signed)
Please call to schedule visco injections.  Approved for Orthovisc, Left knee(s). [employer] Stable Deductible does not apply Once the OOP has been met, patient is covered at 100% ($4000 / met $58.25) Prior Authorization is not required Reference #[id number]

## 2022-09-14 NOTE — Telephone Encounter (Signed)
Patient is out of town and cannot schedule at this time.

## 2022-09-16 ENCOUNTER — Ambulatory Visit (HOSPITAL_COMMUNITY)
Admission: EM | Admit: 2022-09-16 | Discharge: 2022-09-16 | Disposition: A | Discharge status: Home / Self Care | Payer: Medicare PPO | Attending: Emergency Medicine | Admitting: Emergency Medicine

## 2022-09-16 ENCOUNTER — Encounter (HOSPITAL_COMMUNITY): Payer: Self-pay

## 2022-09-16 DIAGNOSIS — M545 Low back pain, unspecified: Secondary | ICD-10-CM | POA: Diagnosis not present

## 2022-09-16 MED ORDER — NAPROXEN 500 MG PO TABS: 500.0000 mg | ORAL_TABLET | Freq: Two times a day (BID) | ORAL | 0 refills | Status: AC

## 2022-09-16 NOTE — Discharge Instructions (Signed)
Your back pain appears musculoskeletal in nature, most likely from your injury at the airport due to luggage.  Please take the anti-inflammatories as prescribed, rest, ice or heat the area and do gentle stretching.  If your symptoms persist beyond the next few weeks, please follow-up with your orthopedic provider.  Please seek immediate care if you develop numbness, tingling, weakness, loss of bowel or bladder function, or any new concerning symptoms.
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
## 2022-09-20 ENCOUNTER — Encounter: Payer: Self-pay | Admitting: Rheumatology

## 2022-10-09 ENCOUNTER — Other Ambulatory Visit: Payer: Self-pay | Admitting: Rheumatology

## 2022-10-10 NOTE — Telephone Encounter (Signed)
Last Fill: 09/24/2021  Next Visit: 11/23/2022  Last Visit: 06/22/2022  Dx: Rheumatoid arthritis with rheumatoid factor of multiple sites without organ or systems involvement   Current Dose per office note on 06/22/2022: folic acid 2 mg by mouth daily.   Okay to refill Folic Acid?

## 2022-10-19 ENCOUNTER — Ambulatory Visit: Payer: Medicare PPO | Attending: Physician Assistant | Admitting: Physician Assistant

## 2022-10-19 DIAGNOSIS — M1712 Unilateral primary osteoarthritis, left knee: Secondary | ICD-10-CM

## 2022-10-19 MED ORDER — LIDOCAINE HCL 1 % IJ SOLN
1.5000 mL | INTRAMUSCULAR | Status: AC | PRN
  Administered 2022-10-19: 1.5 mL

## 2022-10-19 MED ORDER — HYALURONAN 30 MG/2ML IX SOSY
30.0000 mg | PREFILLED_SYRINGE | INTRA_ARTICULAR | Status: AC | PRN
  Administered 2022-10-19: 30 mg via INTRA_ARTICULAR

## 2022-10-19 NOTE — Progress Notes (Signed)
   Procedure Note  Patient: Debbie Williams             Date of Birth: 1966-01-20           MRN: [id number]             Visit Date: 10/19/2022  Procedures: Visit Diagnoses:  1. 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 Consent was given by the patient. Immediately prior to procedure a time out was called to verify the correct patient, procedure, equipment, support staff and site/side marked as required. Patient was prepped and draped in the usual sterile fashion.     Patient tolerated the procedure well.  Aftercare was discussed.  Sherron Ales, PA-C

## 2022-10-26 ENCOUNTER — Ambulatory Visit: Payer: Medicare PPO | Attending: Physician Assistant | Admitting: Physician Assistant

## 2022-10-26 DIAGNOSIS — M1712 Unilateral primary osteoarthritis, left knee: Secondary | ICD-10-CM

## 2022-10-26 MED ORDER — HYALURONAN 30 MG/2ML IX SOSY
30.0000 mg | PREFILLED_SYRINGE | INTRA_ARTICULAR | Status: AC | PRN
  Administered 2022-10-26: 30 mg via INTRA_ARTICULAR

## 2022-10-26 MED ORDER — LIDOCAINE HCL 1 % IJ SOLN
1.5000 mL | INTRAMUSCULAR | Status: AC | PRN
Start: 2022-10-26 — End: 2022-10-26
  Administered 2022-10-26: 1.5 mL

## 2022-10-26 NOTE — Progress Notes (Signed)
   Procedure Note  Patient: Debbie Williams             Date of Birth: 10/05/1965           MRN: [id number]             Visit Date: 10/26/2022  Procedures: Visit Diagnoses:  1. 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 Consent was given by the patient. Immediately prior to procedure a time out was called to verify the correct patient, procedure, equipment, support staff and site/side marked as required. Patient was prepped and draped in the usual sterile fashion.     Patient tolerated the procedure well.  Aftercare was discussed.   Sherron Ales, PA-C

## 2022-11-02 ENCOUNTER — Ambulatory Visit: Payer: Medicare PPO | Attending: Physician Assistant | Admitting: Physician Assistant

## 2022-11-02 DIAGNOSIS — M1712 Unilateral primary osteoarthritis, left knee: Secondary | ICD-10-CM

## 2022-11-02 MED ORDER — HYALURONAN 30 MG/2ML IX SOSY
30.0000 mg | PREFILLED_SYRINGE | INTRA_ARTICULAR | Status: AC | PRN
Start: 2022-11-02 — End: 2022-11-02
  Administered 2022-11-02: 30 mg via INTRA_ARTICULAR

## 2022-11-02 MED ORDER — LIDOCAINE HCL 1 % IJ SOLN
1.5000 mL | INTRAMUSCULAR | Status: AC | PRN
Start: 2022-11-02 — End: 2022-11-02
  Administered 2022-11-02: 1.5 mL

## 2022-11-02 NOTE — Progress Notes (Signed)
   Procedure Note  Patient: Debbie Williams             Date of Birth: 10/01/1965           MRN: [id number]             Visit Date: 11/02/2022  Procedures: Visit Diagnoses:  1. 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 Consent was given by the patient. Immediately prior to procedure a time out was called to verify the correct patient, procedure, equipment, support staff and site/side marked as required. Patient was prepped and draped in the usual sterile fashion.     Patient tolerated the procedure well.  Aftercare was discussed. Sherron Ales, PA-C

## 2022-11-09 NOTE — Progress Notes (Deleted)
Office Visit Note  Patient: Debbie Williams             Date of Birth: 1965-07-20           MRN: [id number]             PCP: Pcp, No Referring: Porfirio Oar, PA Visit Date: 11/23/2022 Occupation: @GUAROCC @  Subjective:  No chief complaint on file.   History of Present Illness: Debbie Williams is a 57 y.o. female ***     Activities of Daily Living:  Patient reports morning stiffness for *** {minute/hour:19697}.   Patient {ACTIONS;DENIES/REPORTS:21021675::"Denies"} nocturnal pain.  Difficulty dressing/grooming: {ACTIONS;DENIES/REPORTS:21021675::"Denies"} Difficulty climbing stairs: {ACTIONS;DENIES/REPORTS:21021675::"Denies"} Difficulty getting out of chair: {ACTIONS;DENIES/REPORTS:21021675::"Denies"} Difficulty using hands for taps, buttons, cutlery, and/or writing: {ACTIONS;DENIES/REPORTS:21021675::"Denies"}  No Rheumatology ROS completed.   PMFS History:  Patient Active Problem List   Diagnosis Date Noted   High risk medication use 03/21/2016   Anxiety 03/21/2016   Elevated blood pressure 10/12/2014   Allergic rhinitis due to pollen 10/12/2014   Rheumatoid arthritis with rheumatoid factor of multiple sites without organ or systems involvement (HCC) 09/18/2014   GAD (generalized anxiety disorder) 09/18/2014   Left knee pain 09/18/2014   Hyperglycemia 09/18/2014   BMI 36.0-36.9,adult 09/18/2014    Past Medical History:  Diagnosis Date   Anxiety    Arthritis    Rheumatoid   Depression    Diabetes mellitus without complication (HCC)     Family History  Problem Relation Age of Onset   Hypertension Mother    Arthritis Mother    Diabetes Maternal Grandmother    Heart disease Maternal Grandmother    Arthritis Maternal Grandmother    Past Surgical History:  Procedure Laterality Date   INTRAUTERINE DEVICE INSERTION  09/2012   Mirena   Social History   Social History Narrative   Separated from her husband, though they share a home for  now.   Their daughter lives with them.   Immunization History  Administered Date(s) Administered   Influenza,inj,Quad PF,6+ Mos 02/10/2015   Moderna Sars-Covid-2 Vaccination 04/04/2020   PFIZER Comirnaty(Gray Top)Covid-19 Tri-Sucrose Vaccine 06/26/2019, 07/24/2019   Pneumococcal Polysaccharide-23 09/18/2014   Tdap 09/18/2014   Zoster, Live 01/14/2015     Objective: Vital Signs: LMP 09/01/2011    Physical Exam   Musculoskeletal Exam: ***  CDAI Exam: CDAI Score: -- Patient Global: --; Provider Global: -- Swollen: --; Tender: -- Joint Exam 11/23/2022   No joint exam has been documented for this visit   There is currently no information documented on the homunculus. Go to the Rheumatology activity and complete the homunculus joint exam.  Investigation: No additional findings.  Imaging: No results found.  Recent Labs: Lab Results  Component Value Date   WBC 6.3 06/22/2022   HGB 12.8 06/22/2022   PLT 426 (H) 06/22/2022   NA 140 06/22/2022   K 4.2 06/22/2022   CL 105 06/22/2022   CO2 24 06/22/2022   GLUCOSE 126 (H) 06/22/2022   BUN 14 06/22/2022   CREATININE 0.83 06/22/2022   BILITOT 0.9 06/22/2022   ALKPHOS 56 08/18/2016   AST 33 06/22/2022   ALT 32 (H) 06/22/2022   PROT 8.6 (H) 06/22/2022   ALBUMIN 4.3 08/18/2016   CALCIUM 10.3 06/22/2022   GFRAA 92 04/08/2020    Speciality Comments: No specialty comments available.  Procedures:  No procedures performed Allergies: Grass extracts [gramineae pollens] and Pollen extract   Assessment / Plan:     Visit Diagnoses: No diagnosis found.  Orders: No orders  of the defined types were placed in this encounter.  No orders of the defined types were placed in this encounter.   Face-to-face time spent with patient was *** minutes. Greater than 50% of time was spent in counseling and coordination of care.  Follow-Up Instructions: No follow-ups on file.   Ellen Henri, CMA  Note - This record has been  created using Animal nutritionist.  Chart creation errors have been sought, but may not always  have been located. Such creation errors do not reflect on  the standard of medical care.

## 2022-11-15 ENCOUNTER — Other Ambulatory Visit: Payer: Self-pay | Admitting: *Deleted

## 2022-11-15 DIAGNOSIS — Z79899 Other long term (current) drug therapy: Secondary | ICD-10-CM

## 2022-11-15 LAB — CBC WITH DIFFERENTIAL/PLATELET
Absolute Monocytes: 374 cells/uL (ref 200–950)
Basophils Absolute: 39 cells/uL (ref 0–200)
Basophils Relative: 0.7 %
Eosinophils Absolute: 99 cells/uL (ref 15–500)
Eosinophils Relative: 1.8 %
HCT: 38.4 % (ref 35.0–45.0)
Hemoglobin: 12.6 g/dL (ref 11.7–15.5)
Lymphs Abs: 2723 cells/uL (ref 850–3900)
MCH: 27.9 pg (ref 27.0–33.0)
MCHC: 32.8 g/dL (ref 32.0–36.0)
MCV: 85.1 fL (ref 80.0–100.0)
MPV: 12 fL (ref 7.5–12.5)
Monocytes Relative: 6.8 %
Neutro Abs: 2266 cells/uL (ref 1500–7800)
Neutrophils Relative %: 41.2 %
Platelets: 345 10*3/uL (ref 140–400)
RBC: 4.51 10*6/uL (ref 3.80–5.10)
RDW: 12.8 % (ref 11.0–15.0)
Total Lymphocyte: 49.5 %
WBC: 5.5 10*3/uL (ref 3.8–10.8)

## 2022-11-16 ENCOUNTER — Other Ambulatory Visit: Payer: Self-pay | Admitting: *Deleted

## 2022-11-16 ENCOUNTER — Encounter: Payer: Self-pay | Admitting: Rheumatology

## 2022-11-16 ENCOUNTER — Other Ambulatory Visit (HOSPITAL_BASED_OUTPATIENT_CLINIC_OR_DEPARTMENT_OTHER): Payer: Self-pay

## 2022-11-16 MED ORDER — METHOTREXATE SODIUM CHEMO INJECTION 50 MG/2ML
15.0000 mg | INTRAMUSCULAR | 0 refills | Status: DC
  Filled 2022-11-16: qty 8, 84d supply, fill #0

## 2022-11-16 MED ORDER — METHOTREXATE SODIUM CHEMO INJECTION 50 MG/2ML: 15.0000 mg | INTRAMUSCULAR | 0 refills | Status: DC

## 2022-11-16 NOTE — Telephone Encounter (Signed)
Last Fill: 07/22/2022  Labs: 11/15/2022 CBC and CMP are normal.   Next Visit: 11/23/2022  Last Visit: 06/22/2022  DX: Rheumatoid arthritis with rheumatoid factor of multiple sites without organ or systems involvement   Current Dose per office note 06/22/2022 lab note reduce the dose of methotrexate to 0.6 mL subcu weekly.   Okay to refill Methotrexate?

## 2022-11-16 NOTE — Progress Notes (Signed)
CBC and CMP are normal.

## 2022-11-17 ENCOUNTER — Other Ambulatory Visit (HOSPITAL_BASED_OUTPATIENT_CLINIC_OR_DEPARTMENT_OTHER): Payer: Self-pay

## 2022-11-23 ENCOUNTER — Ambulatory Visit: Payer: Medicare PPO | Admitting: Rheumatology

## 2022-11-23 DIAGNOSIS — Z8659 Personal history of other mental and behavioral disorders: Secondary | ICD-10-CM

## 2022-11-23 DIAGNOSIS — M0579 Rheumatoid arthritis with rheumatoid factor of multiple sites without organ or systems involvement: Secondary | ICD-10-CM

## 2022-11-23 DIAGNOSIS — M1712 Unilateral primary osteoarthritis, left knee: Secondary | ICD-10-CM

## 2022-11-23 DIAGNOSIS — R7989 Other specified abnormal findings of blood chemistry: Secondary | ICD-10-CM

## 2022-11-23 DIAGNOSIS — Z79899 Other long term (current) drug therapy: Secondary | ICD-10-CM

## 2022-11-23 NOTE — Progress Notes (Deleted)
Office Visit Note  Patient: Debbie Williams             Date of Birth: Aug 25, 1965           MRN: [id number]             PCP: Pcp, No Referring: Porfirio Oar, PA Visit Date: 12/07/2022 Occupation: @GUAROCC @  Subjective:    History of Present Illness: Debbie Williams is a 57 y.o. female with history of seropositive rheumatoid arthritis and osteoarthritis.  Patient remains on Methotrexate 0.8 ML subcutaneous injections every week and folic acid 2 mg by mouth daily.   CBC and CMP updated on 11/15/22.  Her next lab work will be due in October and every 3 months.  Discussed the importance of holding methotrexate if she develops signs or symptoms of an infection and to resume once the infection has completely cleared.   Activities of Daily Living:  Patient reports morning stiffness for *** {minute/hour:19697}.   Patient {ACTIONS;DENIES/REPORTS:21021675::"Denies"} nocturnal pain.  Difficulty dressing/grooming: {ACTIONS;DENIES/REPORTS:21021675::"Denies"} Difficulty climbing stairs: {ACTIONS;DENIES/REPORTS:21021675::"Denies"} Difficulty getting out of chair: {ACTIONS;DENIES/REPORTS:21021675::"Denies"} Difficulty using hands for taps, buttons, cutlery, and/or writing: {ACTIONS;DENIES/REPORTS:21021675::"Denies"}  No Rheumatology ROS completed.   PMFS History:  Patient Active Problem List   Diagnosis Date Noted   High risk medication use 03/21/2016   Anxiety 03/21/2016   Elevated blood pressure 10/12/2014   Allergic rhinitis due to pollen 10/12/2014   Rheumatoid arthritis with rheumatoid factor of multiple sites without organ or systems involvement (HCC) 09/18/2014   GAD (generalized anxiety disorder) 09/18/2014   Left knee pain 09/18/2014   Hyperglycemia 09/18/2014   BMI 36.0-36.9,adult 09/18/2014    Past Medical History:  Diagnosis Date   Anxiety    Arthritis    Rheumatoid   Depression    Diabetes mellitus without complication (HCC)     Family History   Problem Relation Age of Onset   Hypertension Mother    Arthritis Mother    Diabetes Maternal Grandmother    Heart disease Maternal Grandmother    Arthritis Maternal Grandmother    Past Surgical History:  Procedure Laterality Date   INTRAUTERINE DEVICE INSERTION  09/2012   Mirena   Social History   Social History Narrative   Separated from her husband, though they share a home for now.   Their daughter lives with them.   Immunization History  Administered Date(s) Administered   Influenza,inj,Quad PF,6+ Mos 02/10/2015   Moderna Sars-Covid-2 Vaccination 04/04/2020   PFIZER Comirnaty(Gray Top)Covid-19 Tri-Sucrose Vaccine 06/26/2019, 07/24/2019   Pneumococcal Polysaccharide-23 09/18/2014   Tdap 09/18/2014   Zoster, Live 01/14/2015     Objective: Vital Signs: LMP 09/01/2011    Physical Exam Vitals and nursing note reviewed.  Constitutional:      Appearance: She is well-developed.  HENT:     Head: Normocephalic and atraumatic.  Eyes:     Conjunctiva/sclera: Conjunctivae normal.  Cardiovascular:     Rate and Rhythm: Normal rate and regular rhythm.     Heart sounds: Normal heart sounds.  Pulmonary:     Effort: Pulmonary effort is normal.     Breath sounds: Normal breath sounds.  Abdominal:     General: Bowel sounds are normal.     Palpations: Abdomen is soft.  Musculoskeletal:     Cervical back: Normal range of motion.  Lymphadenopathy:     Cervical: No cervical adenopathy.  Skin:    General: Skin is warm and dry.     Capillary Refill: Capillary refill takes less than 2 seconds.  Neurological:  Mental Status: She is alert and oriented to person, place, and time.  Psychiatric:        Behavior: Behavior normal.      Musculoskeletal Exam: C-spine, thoracic spine, and lumbar spine good ROM.  Shoulder joints, elbow joints, wrist joints, MCPs, PIPs, and DIPs good ROM with no synovitis.  Hip joints, knee joints, and ankle joints have good ROM with no discomfort.  No  warmth or effusion of knee joints.  No tenderness or swelling of ankle joints.   CDAI Exam: CDAI Score: -- Patient Global: --; Provider Global: -- Swollen: --; Tender: -- Joint Exam 12/07/2022   No joint exam has been documented for this visit   There is currently no information documented on the homunculus. Go to the Rheumatology activity and complete the homunculus joint exam.  Investigation: No additional findings.  Imaging: No results found.  Recent Labs: Lab Results  Component Value Date   WBC 5.5 11/15/2022   HGB 12.6 11/15/2022   PLT 345 11/15/2022   NA 140 11/15/2022   K 4.4 11/15/2022   CL 106 11/15/2022   CO2 24 11/15/2022   GLUCOSE 107 (H) 11/15/2022   BUN 13 11/15/2022   CREATININE 0.80 11/15/2022   BILITOT 0.8 11/15/2022   ALKPHOS 56 08/18/2016   AST 15 11/15/2022   ALT 16 11/15/2022   PROT 8.0 11/15/2022   ALBUMIN 4.3 08/18/2016   CALCIUM 10.2 11/15/2022   GFRAA 92 04/08/2020    Speciality Comments: No specialty comments available.  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  No orders of the defined types were placed in this encounter.    Follow-Up Instructions: No follow-ups on file.   Gearldine Bienenstock, PA-C  Note - This record has been created using Dragon software.  Chart creation errors have been sought, but may not always  have been located. Such creation errors do not reflect on  the standard of medical care.

## 2022-12-07 ENCOUNTER — Ambulatory Visit: Payer: Medicare PPO | Admitting: Physician Assistant

## 2022-12-07 DIAGNOSIS — Z8659 Personal history of other mental and behavioral disorders: Secondary | ICD-10-CM

## 2022-12-07 DIAGNOSIS — R7989 Other specified abnormal findings of blood chemistry: Secondary | ICD-10-CM

## 2022-12-07 DIAGNOSIS — Z79899 Other long term (current) drug therapy: Secondary | ICD-10-CM

## 2022-12-07 DIAGNOSIS — M1712 Unilateral primary osteoarthritis, left knee: Secondary | ICD-10-CM

## 2022-12-07 DIAGNOSIS — M0579 Rheumatoid arthritis with rheumatoid factor of multiple sites without organ or systems involvement: Secondary | ICD-10-CM

## 2022-12-07 NOTE — Progress Notes (Signed)
Office Visit Note  Patient: Debbie Williams             Date of Birth: 1965/08/23           MRN: [id number]             PCP: Pcp, No Referring: Porfirio Oar, PA Visit Date: 12/21/2022 Occupation: @GUAROCC @  Subjective:  Medication monitoring  History of Present Illness: Debbie Williams is a 57 y.o. female with history of seropositive rheumatoid arthritis and osteoarthritis.  Patient remains on Methotrexate 0.8 ML subcutaneous injections every week and folic acid 2 mg by mouth daily.  She is tolerating methotrexate without any side effects or injection site reactions.  She has not had any interruptions in therapy.  She denies any recent or recurrent infections.  Patient states that she has been able to walk and jog for exercise regular basis without difficulty.  She noticed improvement after undergoing Visco gel injections for the left knee.  She denies any joint swelling at this time.  Patient states that at the end of [place] injured her lower back.  She was evaluated at urgent care upon her return and was prescribed naproxen which helped to alleviate her discomfort.  She is now seeing her psychiatrist and her therapist to work through the emotional trauma surrounding the incident.  Patient has been trying to manage her stress levels so she would not have a rheumatoid arthritis flare in the process.    Activities of Daily Living:  Patient reports morning stiffness for 0 minutes.   Patient Denies nocturnal pain.  Difficulty dressing/grooming: Denies Difficulty climbing stairs: Denies Difficulty getting out of chair: Denies Difficulty using hands for taps, buttons, cutlery, and/or writing: Denies  Review of Systems  Constitutional:  Positive for fatigue.  HENT:  Negative for mouth sores and mouth dryness.   Eyes:  Negative for dryness.  Respiratory:  Negative for shortness of breath.    Cardiovascular:  Positive for palpitations. Negative for chest pain.  Gastrointestinal:  Negative for blood in stool, constipation and diarrhea.  Endocrine: Negative for increased urination.  Genitourinary:  Negative for involuntary urination.  Musculoskeletal:  Negative for joint pain, gait problem, joint pain, joint swelling, myalgias, muscle weakness, morning stiffness, muscle tenderness and myalgias.  Skin:  Negative for color change, rash, hair loss and sensitivity to sunlight.  Allergic/Immunologic: Negative for susceptible to infections.  Neurological:  Negative for dizziness and headaches.  Hematological:  Negative for swollen glands.  Psychiatric/Behavioral:  Negative for depressed mood and sleep disturbance. The patient is nervous/anxious.     PMFS History:  Patient Active Problem List   Diagnosis Date Noted   High risk medication use 03/21/2016   Anxiety 03/21/2016   Elevated blood pressure 10/12/2014   Allergic rhinitis due to pollen 10/12/2014   Rheumatoid arthritis with rheumatoid factor of multiple sites without organ or systems involvement (HCC) 09/18/2014   GAD (generalized anxiety disorder) 09/18/2014   Left knee pain 09/18/2014   Hyperglycemia 09/18/2014   BMI 36.0-36.9,adult 09/18/2014    Past Medical History:  Diagnosis Date   Anxiety    Arthritis    Rheumatoid   Depression    Diabetes mellitus without complication (HCC)     Family History  Problem Relation Age of Onset   Hypertension Mother    Arthritis Mother    Diabetes Maternal Grandmother    Heart disease Maternal Grandmother    Arthritis Maternal  Grandmother    Past Surgical History:  Procedure Laterality Date   INTRAUTERINE DEVICE INSERTION  09/2012   Mirena   Social History   Social History Narrative   Separated from her husband, though they share a home for now.   Their daughter lives with them.   Immunization History  Administered Date(s) Administered   Influenza,inj,Quad PF,6+  Mos 02/10/2015   Moderna Sars-Covid-2 Vaccination 04/04/2020   PFIZER Comirnaty(Gray Top)Covid-19 Tri-Sucrose Vaccine 06/26/2019, 07/24/2019   Pneumococcal Polysaccharide-23 09/18/2014   Tdap 09/18/2014   Zoster, Live 01/14/2015     Objective: Vital Signs: BP (!) 151/82 (BP Location: Left Arm, Patient Position: Sitting, Cuff Size: Large)   Pulse 82   Ht 5\' 6"  (1.676 m)   Wt 205 lb 3.2 oz (93.1 kg)   LMP 09/01/2011   BMI 33.12 kg/m    Physical Exam Vitals and nursing note reviewed.  Constitutional:      Appearance: She is well-developed.  HENT:     Head: Normocephalic and atraumatic.  Eyes:     Conjunctiva/sclera: Conjunctivae normal.  Cardiovascular:     Rate and Rhythm: Normal rate and regular rhythm.     Heart sounds: Normal heart sounds.  Pulmonary:     Effort: Pulmonary effort is normal.     Breath sounds: Normal breath sounds.  Abdominal:     General: Bowel sounds are normal.     Palpations: Abdomen is soft.  Musculoskeletal:     Cervical back: Normal range of motion.  Lymphadenopathy:     Cervical: No cervical adenopathy.  Skin:    General: Skin is warm and dry.     Capillary Refill: Capillary refill takes less than 2 seconds.  Neurological:     Mental Status: She is alert and oriented to person, place, and time.  Psychiatric:        Behavior: Behavior normal.      Musculoskeletal Exam: C-spine, thoracic spine, and lumbar spine good ROM.  No midline spinal tenderness or SI joint tenderness upon palpation today.  Shoulder joints, elbow joints, wrist joints, MCPs, PIPs, and DIPs good ROM with no synovitis.  Hip joints, knee joints, and ankle joints have good ROM with no discomfort.  No warmth or effusion of knee joints.  No tenderness or swelling of ankle joints.  No tenderness or synovitis over MTP joints.  CDAI Exam: CDAI Score: -- Patient Global: 10 / 100; Provider Global: 10 / 100 Swollen: --; Tender: -- Joint Exam 12/21/2022   No joint exam has been  documented for this visit   There is currently no information documented on the homunculus. Go to the Rheumatology activity and complete the homunculus joint exam.  Investigation: No additional findings.  Imaging: No results found.  Recent Labs: Lab Results  Component Value Date   WBC 5.5 11/15/2022   HGB 12.6 11/15/2022   PLT 345 11/15/2022   NA 140 11/15/2022   K 4.4 11/15/2022   CL 106 11/15/2022   CO2 24 11/15/2022   GLUCOSE 107 (H) 11/15/2022   BUN 13 11/15/2022   CREATININE 0.80 11/15/2022   BILITOT 0.8 11/15/2022   ALKPHOS 56 08/18/2016   AST 15 11/15/2022   ALT 16 11/15/2022   PROT 8.0 11/15/2022   ALBUMIN 4.3 08/18/2016   CALCIUM 10.2 11/15/2022   GFRAA 92 04/08/2020    Speciality Comments: No specialty comments available.  Procedures:  No procedures performed Allergies: Grass extracts [gramineae pollens] and Pollen extract   Assessment / Plan:  Visit Diagnoses: Rheumatoid arthritis with rheumatoid factor of multiple sites without organ or systems involvement [facility]): She has no joint tenderness or synovitis on examination today.  She has not had any signs or symptoms of a rheumatoid arthritis flare.  She is clinically doing well on methotrexate 0.8 mL subcu injections once weekly along with folic acid 2 mg daily.  She is tolerating methotrexate without any side effects or injection site reactions.  She has not had any recent or recurrent infections.  No gaps in therapy recently.  Patient remain on methotrexate as prescribed.  She was advised to notify us if she develops signs or symptoms of a flare.  She will follow-up in the office in 5 months or sooner if needed.  High risk medication use:  Methotrexate 0.8 ML subcutaneous injections every week and folic acid 2 mg by mouth daily.  CBC and CMP updated on 11/15/22.  Her next lab work will be due in October and every 3 months.  No recent or recurrent infections.  Discussed the importance of holding methotrexate if  she develops signs or symptoms of an infection and to resume once the infection has completely cleared.   Elevated LFTs: LFTs within normal limits on 11/15/2022.  Primary osteoarthritis and rheumatoid arthritis of left knee: Improved.  Patient underwent viscosupplementation for the left knee in July 2024 and has noticed a significant improvement in her symptoms.  She has been able to walk and jog for exercise without difficulty.  On examination today no warmth or effusion was noted.  History of anxiety: She has been taking Zoloft as prescribed patient has been seeing her psychiatrist and therapist.  Orders: No orders of the defined types were placed in this encounter.  No orders of the defined types were placed in this encounter.    Follow-Up Instructions: Return in about 5 months (around 05/23/2023) for Rheumatoid arthritis.   Gearldine Bienenstock, PA-C  Note - This record has been created using Dragon software.  Chart creation errors have been sought, but may not always  have been located. Such creation errors do not reflect on  the standard of medical care.

## 2022-12-21 ENCOUNTER — Ambulatory Visit: Payer: Medicare PPO | Attending: Rheumatology | Admitting: Physician Assistant

## 2022-12-21 ENCOUNTER — Encounter: Payer: Self-pay | Admitting: Physician Assistant

## 2022-12-21 VITALS — BP 151/82 | HR 82 | Ht 66.0 in | Wt 205.2 lb

## 2022-12-21 DIAGNOSIS — Z8659 Personal history of other mental and behavioral disorders: Secondary | ICD-10-CM

## 2022-12-21 DIAGNOSIS — M1712 Unilateral primary osteoarthritis, left knee: Secondary | ICD-10-CM | POA: Diagnosis not present

## 2022-12-21 DIAGNOSIS — Z79899 Other long term (current) drug therapy: Secondary | ICD-10-CM

## 2022-12-21 DIAGNOSIS — R7989 Other specified abnormal findings of blood chemistry: Secondary | ICD-10-CM | POA: Diagnosis not present

## 2022-12-21 DIAGNOSIS — M0579 Rheumatoid arthritis with rheumatoid factor of multiple sites without organ or systems involvement: Secondary | ICD-10-CM | POA: Diagnosis not present

## 2022-12-21 NOTE — Patient Instructions (Addendum)
Standing Labs We placed an order today for your standing lab work.   Please have your standing labs drawn in end-October and every 3 months   Please have your labs drawn 2 weeks prior to your appointment so that the provider can discuss your lab results at your appointment, if possible.  Please note that you may see your imaging and lab results in MyChart before we have reviewed them. We will contact you once all results are reviewed. Please allow our office up to 72 hours to thoroughly review all of the results before contacting the office for clarification of your results.  WALK-IN LAB HOURS  Monday through Thursday from 8:00 am -12:30 pm and 1:00 pm-5:00 pm and Friday from 8:00 am-12:00 pm.  Patients with office visits requiring labs will be seen before walk-in labs.  You may encounter longer than normal wait times. Please allow additional time. Wait times may be shorter on  Monday and Thursday afternoons.  We do not book appointments for walk-in labs. We appreciate your patience and understanding with our staff.   Labs are drawn by [facility]. Please bring your co-pay at the time of your lab draw.  You may receive a bill from [facility] for your lab work.  Please note if you are on Hydroxychloroquine and and an order has been placed for a Hydroxychloroquine level,  you will need to have it drawn 4 hours or more after your last dose.  If you wish to have your labs drawn at another location, please call the office 24 hours in advance so we can fax the orders.  The office is located at [address], [address], [place], [place] [address]   If you have any questions regarding directions or hours of operation,  please call [phone].   As a reminder, please drink plenty of water prior to coming for your lab work. Thanks!

## 2023-01-06 ENCOUNTER — Encounter: Payer: Medicare PPO | Admitting: Obstetrics and Gynecology

## 2023-01-23 ENCOUNTER — Encounter: Payer: Medicare PPO | Admitting: Obstetrics and Gynecology

## 2023-01-27 ENCOUNTER — Other Ambulatory Visit: Payer: Self-pay

## 2023-01-27 ENCOUNTER — Other Ambulatory Visit (HOSPITAL_BASED_OUTPATIENT_CLINIC_OR_DEPARTMENT_OTHER): Payer: Self-pay

## 2023-01-27 ENCOUNTER — Other Ambulatory Visit: Payer: Self-pay | Admitting: Physician Assistant

## 2023-01-27 MED ORDER — METHOTREXATE SODIUM CHEMO INJECTION 50 MG/2ML
15.0000 mg | INTRAMUSCULAR | 0 refills | Status: DC
  Filled 2023-01-27 (×2): qty 8, 90d supply, fill #0

## 2023-01-27 NOTE — Telephone Encounter (Signed)
Last Fill: 11/16/2022  Labs: 11/15/2022  Next Visit: 05/24/2023  Last Visit: 12/21/2022  DX: Rheumatoid arthritis with rheumatoid factor of multiple sites without organ or systems involvement   Current Dose per office note 12/21/2022:  Methotrexate 0.8 ML subcutaneous injections every week   Okay to refill Methotrexate?

## 2023-02-01 ENCOUNTER — Other Ambulatory Visit (HOSPITAL_COMMUNITY)
Admission: RE | Admit: 2023-02-01 | Discharge: 2023-02-01 | Disposition: A | Discharge status: Home / Self Care | Payer: Medicare PPO | Source: Ambulatory Visit | Attending: Obstetrics and Gynecology | Admitting: Obstetrics and Gynecology

## 2023-02-01 ENCOUNTER — Encounter: Payer: Self-pay | Admitting: Obstetrics and Gynecology

## 2023-02-01 ENCOUNTER — Ambulatory Visit (INDEPENDENT_AMBULATORY_CARE_PROVIDER_SITE_OTHER): Payer: Medicare PPO | Admitting: Obstetrics and Gynecology

## 2023-02-01 VITALS — BP 122/74 | HR 89 | Ht 66.0 in | Wt 207.8 lb

## 2023-02-01 DIAGNOSIS — Z7251 High risk heterosexual behavior: Secondary | ICD-10-CM | POA: Diagnosis not present

## 2023-02-01 DIAGNOSIS — Z124 Encounter for screening for malignant neoplasm of cervix: Secondary | ICD-10-CM

## 2023-02-01 DIAGNOSIS — Z1151 Encounter for screening for human papillomavirus (HPV): Secondary | ICD-10-CM | POA: Insufficient documentation

## 2023-02-01 DIAGNOSIS — Z01419 Encounter for gynecological examination (general) (routine) without abnormal findings: Secondary | ICD-10-CM | POA: Insufficient documentation

## 2023-02-01 DIAGNOSIS — Z9189 Other specified personal risk factors, not elsewhere classified: Secondary | ICD-10-CM | POA: Diagnosis not present

## 2023-02-01 DIAGNOSIS — Z113 Encounter for screening for infections with a predominantly sexual mode of transmission: Secondary | ICD-10-CM

## 2023-02-01 NOTE — Assessment & Plan Note (Addendum)
Cervical cancer screening performed according to ASCCP guidelines. Encouraged annual mammogram screening Colonscopy: never; planning to complete cologuard screen with PCP DXA due 2/2 RA Labs and immunizations with her primary Encouraged safe sexual practices as indicated Encouraged healthy lifestyle practices with diet and exercise

## 2023-02-01 NOTE — Progress Notes (Signed)
57 y.o. [id number] postmenopausal female with RA here for annual exam- [employer]. Separated from husband. New partner this year. Patient's last menstrual period was 09/01/2011.   Patient would like HIV testing.   Abnormal bleeding: none Pelvic discharge or pain: none Breast mass, nipple discharge or skin changes : none  Last PAP: 08/02/17 NIL, HPV neg Last mammogram: June 2024, no report on file Last colonoscopy: planning to complete cologuard.               Sexually active: yes, new partner this year  Exercising: yes runs 1 mile 3x a week with daughter  GYN HISTORY: [place] [employer]are: 1st intercourse 57 yo; More than 5 lifetime partners   OB History  Gravida Para Term Preterm AB Living  3       2 1   SAB IAB Ectopic Multiple Live Births    2          # Outcome Date GA Lbr Len/2nd Weight Sex Type Anes PTL Lv  3 IAB           2 IAB           1 Gravida             Past Medical History:  Diagnosis Date   Anxiety    Arthritis    Rheumatoid   Depression    Diabetes mellitus without complication (HCC)     Past Surgical History:  Procedure Laterality Date   INTRAUTERINE DEVICE INSERTION  09/2012   Mirena    Current Outpatient Medications on File Prior to Visit  Medication Sig Dispense Refill   APPLE CIDER VINEGAR PO Take by mouth 2 (two) times daily.     folic acid (FOLVITE) 1 MG tablet TAKE 2 TABLETS BY MOUTH EVERY DAY 180 tablet 3   methotrexate 50 MG/2ML injection Inject 0.6 mLs (15 mg total) into the skin once a week. 8 mL 0   Multiple Vitamin (MULTIVITAMIN) tablet Take 1 tablet by mouth daily.     Omega-3 Fatty Acids (FISH OIL) 1000 MG CAPS Take 1 capsule by mouth daily.     Omega-3 Fatty Acids (SALMON OIL PO) Take by mouth daily.     ramelteon (ROZEREM) 8 MG tablet Take 8 mg by mouth at bedtime as needed.     sertraline (ZOLOFT) 100 MG tablet Take 200 mg by mouth daily.      TUBERCULIN SYR 1CC/27GX1/2" (B-D TB SYRINGE 1CC/27GX1/2") 27G X 1/2" 1 ML MISC TO USE WITH  INJECTABLE METHOTREXATE ONCE WEEKLY 12 each 3   No current facility-administered medications on file prior to visit.    Social History   Socioeconomic History   Marital status: Legally Separated    Spouse name: n/a   Number of children: 1   Years of education: Not on file   Highest education level: Not on file  Occupational History   Occupation: retired/disability    Comment: Warden/ranger  Tobacco Use   Smoking status: Never    Passive exposure: Never   Smokeless tobacco: Never  Vaping Use   Vaping status: Never Used  Substance and Sexual Activity   Alcohol use: Yes    Alcohol/week: 0.0 standard drinks of alcohol    Comment: occ   Drug use: No   Sexual activity: Yes    Birth control/protection: Post-menopausal    Comment: Mirena 09/2012-1st intercourse 57 yo-More than 5 partners  Other Topics Concern   Not on file  Social History Narrative  Separated from her husband, though they share a home for now.   Their daughter lives with them.   Social Determinants of Health   Financial Resource Strain: Not on file  Food Insecurity: Not on file  Transportation Needs: Not on file  Physical Activity: Not on file  Stress: Not on file  Social Connections: Not on file  Intimate Partner Violence: Not on file    Family History  Problem Relation Age of Onset   Hypertension Mother    Arthritis Mother    Diabetes Maternal Grandmother    Heart disease Maternal Grandmother    Arthritis Maternal Grandmother    Breast cancer Neg Hx    Ovarian cancer Neg Hx     Allergies  Allergen Reactions   Grass Extracts [Gramineae Pollens]    Pollen Extract       PE Today's Vitals   02/01/23 1019  BP: 122/74  Pulse: 89  SpO2: 99%  Weight: 207 lb 12.8 oz (94.3 kg)  Height: 5\' 6"  (1.676 m)   Body mass index is 33.54 kg/m.  Physical Exam Vitals reviewed. Exam conducted with a chaperone present.  Constitutional:      General: She is not in acute distress.     Appearance: Normal appearance.  HENT:     Head: Normocephalic and atraumatic.     Nose: Nose normal.  Eyes:     Extraocular Movements: Extraocular movements intact.     Conjunctiva/sclera: Conjunctivae normal.  Neck:     Thyroid: No thyroid mass, thyromegaly or thyroid tenderness.  Pulmonary:     Effort: Pulmonary effort is normal.  Chest:     Chest wall: No mass or tenderness.  Breasts:    Right: Normal. No swelling, mass, nipple discharge, skin change or tenderness.     Left: Normal. No swelling, mass, nipple discharge, skin change or tenderness.  Abdominal:     General: There is no distension.     Palpations: Abdomen is soft.     Tenderness: There is no abdominal tenderness.  Genitourinary:    General: Normal vulva.     Exam position: Lithotomy position.     Urethra: No prolapse.     Vagina: Normal. No vaginal discharge or bleeding.     Cervix: Normal. No lesion.     Uterus: Normal. Not enlarged and not tender.      Adnexa: Right adnexa normal and left adnexa normal.  Musculoskeletal:        General: Normal range of motion.     Cervical back: Normal range of motion.  Lymphadenopathy:     Upper Body:     Right upper body: No axillary adenopathy.     Left upper body: No axillary adenopathy.     Lower Body: No right inguinal adenopathy. No left inguinal adenopathy.  Skin:    General: Skin is warm and dry.  Neurological:     General: No focal deficit present.     Mental Status: She is alert.  Psychiatric:        Mood and Affect: Mood normal.        Behavior: Behavior normal.       Assessment and Plan:        Well woman exam with routine gynecological exam Assessment & Plan: Cervical cancer screening performed according to ASCCP guidelines. Encouraged annual mammogram screening Colonscopy: never; planning to complete cologuard screen with PCP DXA due 2/2 RA Labs and immunizations with her primary Encouraged safe sexual practices as indicated Encouraged healthy  lifestyle practices with diet and exercise    Cervical cancer screening -     Cytology - PAP  Other specified personal risk factors, not elsewhere classified  At risk for osteoporosis -     DG Bone Density; Future  Screen for STD (sexually transmitted disease) -     Cytology - PAP -     RPR -     HIV Antibody (routine testing w rflx)    Rosalyn Gess, MD

## 2023-02-01 NOTE — Patient Instructions (Signed)
Health Maintenance, Female Adopting a healthy lifestyle and getting preventive care are important in promoting health and wellness. Ask your health care provider about: The right schedule for you to have regular tests and exams. Things you can do on your own to prevent diseases and keep yourself healthy. What should I know about diet, weight, and exercise? Eat a healthy diet  Eat a diet that includes plenty of vegetables, fruits, low-fat dairy products, and lean protein. Do not eat a lot of foods that are high in solid fats, added sugars, or sodium. Maintain a healthy weight Body mass index (BMI) is used to identify weight problems. It estimates body fat based on height and weight. Your health care provider can help determine your BMI and help you achieve or maintain a healthy weight. Get regular exercise Get regular exercise. This is one of the most important things you can do for your health. Most adults should: Exercise for at least 150 minutes each week. The exercise should increase your heart rate and make you sweat (moderate-intensity exercise). Do strengthening exercises at least twice a week. This is in addition to the moderate-intensity exercise. Spend less time sitting. Even light physical activity can be beneficial. Watch cholesterol and blood lipids Have your blood tested for lipids and cholesterol at 58 years of age, then have this test every 5 years. Have your cholesterol levels checked more often if: Your lipid or cholesterol levels are high. You are older than 58 years of age. You are at high risk for heart disease. What should I know about cancer screening? Depending on your health history and family history, you may need to have cancer screening at various ages. This may include screening for: Breast cancer. Cervical cancer. Colorectal cancer. Skin cancer. Lung cancer. What should I know about heart disease, diabetes, and high blood pressure? Blood pressure and heart  disease High blood pressure causes heart disease and increases the risk of stroke. This is more likely to develop in people who have high blood pressure readings or are overweight. Have your blood pressure checked: Every 3-5 years if you are 58-58 years of age. Every year if you are 58 years old or older. Diabetes Have regular diabetes screenings. This checks your fasting blood sugar level. Have the screening done: Once every three years after age 58 if you are at a normal weight and have a low risk for diabetes. More often and at a younger age if you are overweight or have a high risk for diabetes. What should I know about preventing infection? Hepatitis B If you have a higher risk for hepatitis B, you should be screened for this virus. Talk with your health care provider to find out if you are at risk for hepatitis B infection. Hepatitis C Testing is recommended for: Everyone born from [birth year] through 1966. Anyone with known risk factors for hepatitis C. Sexually transmitted infections (STIs) Get screened for STIs, including gonorrhea and chlamydia, if: You are sexually active and are younger than 58 years of age. You are older than 58 years of age and your health care provider tells you that you are at risk for this type of infection. Your sexual activity has changed since you were last screened, and you are at increased risk for chlamydia or gonorrhea. Ask your health care provider if you are at risk. Ask your health care provider about whether you are at high risk for HIV. Your health care provider may recommend a prescription medicine to help prevent HIV  infection. If you choose to take medicine to prevent HIV, you should first get tested for HIV. You should then be tested every 3 months for as long as you are taking the medicine. Osteoporosis and menopause Osteoporosis is a disease in which the bones lose minerals and strength with aging. This can result in bone fractures. If you are 58  years old or older, or if you are at risk for osteoporosis and fractures, ask your health care provider if you should: Be screened for bone loss. Take a calcium or vitamin D supplement to lower your risk of fractures. Be given hormone replacement therapy (HRT) to treat symptoms of menopause. Follow these instructions at home: Alcohol use Do not drink alcohol if: Your health care provider tells you not to drink. You are pregnant, may be pregnant, or are planning to become pregnant. If you drink alcohol: Limit how much you have to: 0-1 drink a day. Know how much alcohol is in your drink. In the [place] one drink equals one 12 oz bottle of beer (355 mL), one 5 oz glass of wine (148 mL), or one 1 oz glass of hard liquor (44 mL). Lifestyle Do not use any products that contain nicotine or tobacco. These products include cigarettes, chewing tobacco, and vaping devices, such as e-cigarettes. If you need help quitting, ask your health care provider. Do not use street drugs. Do not share needles. Ask your health care provider for help if you need support or information about quitting drugs. General instructions Schedule regular health, dental, and eye exams. Stay current with your vaccines. Tell your health care provider if: You often feel depressed. You have ever been abused or do not feel safe at home. Summary Adopting a healthy lifestyle and getting preventive care are important in promoting health and wellness. Follow your health care provider's instructions about healthy diet, exercising, and getting tested or screened for diseases. Follow your health care provider's instructions on monitoring your cholesterol and blood pressure. This information is not intended to replace advice given to you by your health care provider. Make sure you discuss any questions you have with your health care provider. Document Revised: 08/24/2020 Document Reviewed: 08/24/2020 Elsevier Patient Education  2024  [employer].

## 2023-02-03 LAB — CYTOLOGY - PAP
Chlamydia: NEGATIVE
Comment: NEGATIVE
Comment: NEGATIVE
Comment: NEGATIVE
Comment: NORMAL
Diagnosis: NEGATIVE
High risk HPV: NEGATIVE
Neisseria Gonorrhea: NEGATIVE
Trichomonas: NEGATIVE

## 2023-02-03 LAB — HIV ANTIBODY (ROUTINE TESTING W REFLEX): HIV 1&2 Ab, 4th Generation: NONREACTIVE

## 2023-02-03 LAB — RPR: RPR Ser Ql: NONREACTIVE

## 2023-03-29 ENCOUNTER — Other Ambulatory Visit: Payer: Self-pay | Admitting: *Deleted

## 2023-03-29 DIAGNOSIS — Z9189 Other specified personal risk factors, not elsewhere classified: Secondary | ICD-10-CM

## 2023-04-25 ENCOUNTER — Other Ambulatory Visit: Payer: Self-pay

## 2023-04-26 ENCOUNTER — Other Ambulatory Visit: Payer: Self-pay

## 2023-05-01 ENCOUNTER — Other Ambulatory Visit (HOSPITAL_BASED_OUTPATIENT_CLINIC_OR_DEPARTMENT_OTHER): Payer: Self-pay

## 2023-05-10 NOTE — Progress Notes (Signed)
 Office Visit Note  Patient: Debbie Williams             Date of Birth: 12-02-1965           MRN: [id number]             PCP: Pcp, No Referring: No ref. provider found Visit Date: 05/24/2023 Occupation: @GUAROCC @  Subjective:  Medication management  History of Present Illness: Debbie Williams is a 58 y.o. female with seropositive rheumatoid arthritis and osteoarthritis overlap.  She returns today after last visit in September 2024.  Her dose of methotrexate  was decreased from 0.8 mL subcu weekly to 0.6 mL subcu weekly due to mild elevation in the LFTs in March 2024.  Patient states she was doing better on the higher dose of methotrexate .  She has been experiencing some stiffness and fatigue prior to her next dose of methotrexate .  She states that she has been walking and running.  She has no difficulty exercising.  She had good response to viscosupplement injections to her knee joint.  None of the joints are painful today per patient.    Activities of Daily Living:  Patient reports morning stiffness for 0 minutes.   Patient Denies nocturnal pain.  Difficulty dressing/grooming: Denies Difficulty climbing stairs: Denies Difficulty getting out of chair: Denies Difficulty using hands for taps, buttons, cutlery, and/or writing: Denies  Review of Systems  Constitutional:  Positive for fatigue.  HENT:  Negative for mouth sores and mouth dryness.   Eyes:  Negative for pain and dryness.  Respiratory:  Negative for shortness of breath and difficulty breathing.   Cardiovascular:  Negative for chest pain and palpitations.  Gastrointestinal:  Positive for nausea. Negative for blood in stool, constipation and diarrhea.  Endocrine: Negative for increased urination.  Genitourinary:  Negative for involuntary urination.  Musculoskeletal:  Negative for joint pain, gait problem, joint pain, joint swelling, myalgias, muscle weakness, morning stiffness, muscle tenderness and  myalgias.  Skin:  Negative for color change, rash, hair loss and sensitivity to sunlight.  Allergic/Immunologic: Negative for susceptible to infections.  Neurological:  Negative for dizziness and headaches.  Hematological:  Negative for swollen glands.  Psychiatric/Behavioral:  Negative for depressed mood and sleep disturbance. The patient is not nervous/anxious.     PMFS History:  Patient Active Problem List   Diagnosis Date Noted   Other specified personal risk factors, not elsewhere classified 02/01/2023   Well woman exam with routine gynecological exam 02/01/2023   High risk medication use 03/21/2016   Anxiety 03/21/2016   Elevated blood pressure 10/12/2014   Allergic rhinitis due to pollen 10/12/2014   Rheumatoid arthritis with rheumatoid factor of multiple sites without organ or systems involvement (HCC) 09/18/2014   GAD (generalized anxiety disorder) 09/18/2014   Left knee pain 09/18/2014   Hyperglycemia 09/18/2014   BMI 36.0-36.9,adult 09/18/2014    Past Medical History:  Diagnosis Date   Anxiety    Arthritis    Rheumatoid   Depression    Diabetes mellitus without complication (HCC)     Family History  Problem Relation Age of Onset   Hypertension Mother    Arthritis Mother    Diabetes Maternal Grandmother    Heart disease Maternal Grandmother    Arthritis Maternal Grandmother    Breast cancer Neg Hx    Ovarian cancer Neg Hx    Past Surgical History:  Procedure Laterality Date   INTRAUTERINE DEVICE INSERTION  09/2012   Mirena    Social History   Social History Narrative  Separated from her husband, though they share a home for now.   Their daughter lives with them.   Immunization History  Administered Date(s) Administered   Influenza,inj,Quad PF,6+ Mos 02/10/2015   Moderna Sars-Covid-2 Vaccination 04/04/2020   PFIZER Comirnaty(Gray Top)Covid-19 Tri-Sucrose Vaccine 06/26/2019, 07/24/2019   Pneumococcal Polysaccharide-23 09/18/2014   Tdap 09/18/2014    Zoster, Live 01/14/2015     Objective: Vital Signs: BP (!) 166/90 (BP Location: Left Arm, Patient Position: Sitting, Cuff Size: Large)   Pulse 86   Resp 16   Ht 5' 7 (1.702 m)   Wt 214 lb 3.2 oz (97.2 kg)   LMP 09/01/2011   BMI 33.55 kg/m    Physical Exam Vitals and nursing note reviewed.  Constitutional:      Appearance: She is well-developed.  HENT:     Head: Normocephalic and atraumatic.  Eyes:     Conjunctiva/sclera: Conjunctivae normal.  Cardiovascular:     Rate and Rhythm: Normal rate and regular rhythm.     Heart sounds: Normal heart sounds.  Pulmonary:     Effort: Pulmonary effort is normal.     Breath sounds: Normal breath sounds.  Abdominal:     General: Bowel sounds are normal.     Palpations: Abdomen is soft.  Musculoskeletal:     Cervical back: Normal range of motion.  Lymphadenopathy:     Cervical: No cervical adenopathy.  Skin:    General: Skin is warm and dry.     Capillary Refill: Capillary refill takes less than 2 seconds.  Neurological:     Mental Status: She is alert and oriented to person, place, and time.  Psychiatric:        Behavior: Behavior normal.      Musculoskeletal Exam: Cervical, thoracic and lumbar spine 1 good range of motion.  Shoulders, elbows were in good range of motion.  She had very limited extension and flexion in her wrist joints with synovial thickening.  MCP thickening with no synovitis was noted.  No PIP and DIP thickening was noted.  Hip joints and knee joints were in good range of motion.  There was no tenderness over ankles or MTPs.  CDAI Exam: CDAI Score: 9  Patient Global: 0 / 100; Provider Global: 30 / 100 Swollen: 6 ; Tender: 0  Joint Exam 05/24/2023      Right  Left  Wrist  Swollen   Swollen   MCP 2  Swollen   Swollen   MCP 3  Swollen   Swollen      Investigation: No additional findings.  Imaging: No results found.  Recent Labs: Lab Results  Component Value Date   WBC 5.5 11/15/2022   HGB 12.6  11/15/2022   PLT 345 11/15/2022   NA 140 11/15/2022   K 4.4 11/15/2022   CL 106 11/15/2022   CO2 24 11/15/2022   GLUCOSE 107 (H) 11/15/2022   BUN 13 11/15/2022   CREATININE 0.80 11/15/2022   BILITOT 0.8 11/15/2022   ALKPHOS 56 08/18/2016   AST 15 11/15/2022   ALT 16 11/15/2022   PROT 8.0 11/15/2022   ALBUMIN 4.3 08/18/2016   CALCIUM 10.2 11/15/2022   GFRAA 92 04/08/2020    Speciality Comments: No specialty comments available.  Procedures:  No procedures performed Allergies: Grass extracts [gramineae pollens] and Pollen extract   Assessment / Plan:     Visit Diagnoses: Rheumatoid arthritis with rheumatoid factor of multiple sites without organ or systems involvement (HCC)-positive RF, positive anti-CCP, elevated sed rate, erosive  disease: Patient has severe rheumatoid arthritis.  She returns today after her last visit in September 2024.  She states she has been having increased discomfort just prior to the next injection of methotrexate .  Her dose of methotrexate  was reduced to 0.6 mL subcu weekly in March due to slight elevation in the LFTs.  She has not had labs since July 2024.  I advised her to get labs today.  Patient wants to wait until tomorrow as she is concerned about her glucose levels.  We discussed possibly increasing the dose of methotrexate  to 0.8 mL subcu weekly if her labs are normal.  Synovial thickening was noted over wrist joints and MCP joints.  No active synovitis was noted.  She continues to have limited range of motion in her wrist joints with synovial thickening over wrist joints and MCP joints.  High risk medication use - Methotrexate  0.6 ML subcutaneous injections every week and folic acid 2 mg by mouth daily since March 2024 due to mild elevation in her LFTs.  Plan to increase the dose of methotrexate  to 0.8 mL subcutaneous after labs.- Plan: CBC with Differential/Platelet, COMPLETE METABOLIC PANEL WITH GFR.  Patient stated that she will come for labs tomorrow.   Significance of having labs every 3 months to monitor for drug toxicity was emphasized.  Patient voiced understanding.  She was advised to hold methotrexate  if she develops an infection resume after the infection resolves.  Information for immunization was placed in the AVS.  Elevated LFTs-mild elevation of LFTs was noted in March.  Her LFTs are normal now.  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  Patient states she has been doing well since she had viscosupplement injections.  She has been running and jogging.  History of anxiety-patient reports increased anxiety.  She states she gets anxious about the labs and also her visits.  Elevated blood pressure reading-blood pressure was elevated at 179/93.  After resting it came down to 166/90.  She was advised to monitor blood pressure closely and follow-up with her PCP.  Patient states her blood pressure stays normal at home and other doctors visits.  Orders: Orders Placed This Encounter  Procedures   CBC with Differential/Platelet   COMPLETE METABOLIC PANEL WITH GFR   No orders of the defined types were placed in this encounter.    Follow-Up Instructions: Return in about 5 months (around 10/21/2023) for Rheumatoid arthritis.   Maya Nash, MD  Note - This record has been created using Animal nutritionist.  Chart creation errors have been sought, but may not always  have been located. Such creation errors do not reflect on  the standard of medical care.

## 2023-05-24 ENCOUNTER — Encounter: Payer: Self-pay | Admitting: Rheumatology

## 2023-05-24 ENCOUNTER — Ambulatory Visit: Payer: Medicare PPO | Attending: Rheumatology | Admitting: Rheumatology

## 2023-05-24 VITALS — BP 166/90 | HR 86 | Resp 16 | Ht 67.0 in | Wt 214.2 lb

## 2023-05-24 DIAGNOSIS — M1712 Unilateral primary osteoarthritis, left knee: Secondary | ICD-10-CM | POA: Diagnosis not present

## 2023-05-24 DIAGNOSIS — R7989 Other specified abnormal findings of blood chemistry: Secondary | ICD-10-CM | POA: Diagnosis not present

## 2023-05-24 DIAGNOSIS — R030 Elevated blood-pressure reading, without diagnosis of hypertension: Secondary | ICD-10-CM

## 2023-05-24 DIAGNOSIS — Z79899 Other long term (current) drug therapy: Secondary | ICD-10-CM

## 2023-05-24 DIAGNOSIS — M0579 Rheumatoid arthritis with rheumatoid factor of multiple sites without organ or systems involvement: Secondary | ICD-10-CM

## 2023-05-24 DIAGNOSIS — Z8659 Personal history of other mental and behavioral disorders: Secondary | ICD-10-CM

## 2023-05-24 NOTE — Patient Instructions (Addendum)
  Standing Labs We placed an order today for your standing lab work.   Please have your standing labs drawn in February and every 3 months  Please have your labs drawn 2 weeks prior to your appointment so that the provider can discuss your lab results at your appointment, if possible.  Please note that you may see your imaging and lab results in MyChart before we have reviewed them. We will contact you once all results are reviewed. Please allow our office up to 72 hours to thoroughly review all of the results before contacting the office for clarification of your results.  WALK-IN LAB HOURS  Monday through Thursday from 8:00 am -12:30 pm and 1:00 pm-5:00 pm and Friday from 8:00 am-12:00 pm.  Patients with office visits requiring labs will be seen before walk-in labs.  You may encounter longer than normal wait times. Please allow additional time. Wait times may be shorter on  Monday and Thursday afternoons.  We do not book appointments for walk-in labs. We appreciate your patience and understanding with our staff.   Labs are drawn by [facility]. Please bring your co-pay at the time of your lab draw.  You may receive a bill from [facility] for your lab work.  Please note if you are on Hydroxychloroquine and and an order has been placed for a Hydroxychloroquine level,  you will need to have it drawn 4 hours or more after your last dose.  If you wish to have your labs drawn at another location, please call the office 24 hours in advance so we can fax the orders.  The office is located at [address], [place], [place] [address]   If you have any questions regarding directions or hours of operation,  please call [phone].   As a reminder, please drink plenty of water prior to coming for your lab work. Thanks!   Vaccines You are taking a medication(s) that can suppress your immune system.  The following immunizations are recommended: Flu annually Covid-19  RSV Td/Tdap (tetanus,  diphtheria, pertussis) every 10 years Pneumonia (Prevnar 15 then Pneumovax 23 at least 1 year apart.  Alternatively, can take Prevnar 20 without needing additional dose) Shingrix: 2 doses from 4 weeks to 6 months apart  Please check with your PCP to make sure you are up to date.   If you have signs or symptoms of an infection or start antibiotics: First, call your PCP for workup of your infection. Hold your medication through the infection, until you complete your antibiotics, and until symptoms resolve if you take the following: Injectable medication (Actemra, Benlysta, Cimzia, Cosentyx, Enbrel, Humira, Kevzara, Orencia, Remicade, Simponi, Stelara, Taltz, Tremfya) Methotrexate Leflunomide (Arava) Mycophenolate (Cellcept) [name], Olumiant, or Rinvoq

## 2023-05-26 LAB — CBC WITH DIFFERENTIAL/PLATELET
Absolute Lymphocytes: 2862 {cells}/uL (ref 850–3900)
Absolute Monocytes: 342 {cells}/uL (ref 200–950)
Basophils Absolute: 39 {cells}/uL (ref 0–200)
Basophils Relative: 0.7 %
Eosinophils Absolute: 129 {cells}/uL (ref 15–500)
Eosinophils Relative: 2.3 %
HCT: 37.6 % (ref 35.0–45.0)
Hemoglobin: 12.2 g/dL (ref 11.7–15.5)
MCH: 27.6 pg (ref 27.0–33.0)
MCHC: 32.4 g/dL (ref 32.0–36.0)
MCV: 85.1 fL (ref 80.0–100.0)
MPV: 11.3 fL (ref 7.5–12.5)
Monocytes Relative: 6.1 %
Neutro Abs: 2229 {cells}/uL (ref 1500–7800)
Neutrophils Relative %: 39.8 %
Platelets: 340 10*3/uL (ref 140–400)
RBC: 4.42 10*6/uL (ref 3.80–5.10)
RDW: 12.4 % (ref 11.0–15.0)
Total Lymphocyte: 51.1 %
WBC: 5.6 10*3/uL (ref 3.8–10.8)

## 2023-05-26 LAB — COMPLETE METABOLIC PANEL WITH GFR
AG Ratio: 1.4 (calc) (ref 1.0–2.5)
ALT: 23 U/L (ref 6–29)
AST: 20 U/L (ref 10–35)
Albumin: 4.6 g/dL (ref 3.6–5.1)
Alkaline phosphatase (APISO): 70 U/L (ref 37–153)
BUN: 14 mg/dL (ref 7–25)
CO2: 24 mmol/L (ref 20–32)
Calcium: 10.2 mg/dL (ref 8.6–10.4)
Chloride: 105 mmol/L (ref 98–110)
Creat: 0.85 mg/dL (ref 0.50–1.03)
Globulin: 3.4 g/dL (ref 1.9–3.7)
Glucose, Bld: 102 mg/dL — ABNORMAL HIGH (ref 65–99)
Potassium: 4.3 mmol/L (ref 3.5–5.3)
Sodium: 139 mmol/L (ref 135–146)
Total Bilirubin: 0.8 mg/dL (ref 0.2–1.2)
Total Protein: 8 g/dL (ref 6.1–8.1)
eGFR: 79 mL/min/{1.73_m2} (ref >=60)

## 2023-05-26 NOTE — Progress Notes (Signed)
 CBC and CMP are normal.

## 2023-05-29 ENCOUNTER — Encounter: Payer: Self-pay | Admitting: Rheumatology

## 2023-05-29 NOTE — Telephone Encounter (Signed)
 Patient may increase the dose of methotrexate  to 0.8 mL subcu weekly and repeat labs in 2 weeks after increasing the dose of methotrexate .  Then she should get labs every 3 months.

## 2023-05-31 ENCOUNTER — Other Ambulatory Visit (HOSPITAL_BASED_OUTPATIENT_CLINIC_OR_DEPARTMENT_OTHER): Payer: Medicare PPO

## 2023-09-08 ENCOUNTER — Other Ambulatory Visit: Payer: Self-pay | Admitting: Physician Assistant

## 2023-09-08 NOTE — Telephone Encounter (Signed)
 Last Fill: 05/13/2022  Next Visit: 11/08/2023  Last Visit: 05/24/2023  Dx:  Rheumatoid arthritis with rheumatoid factor of multiple sites without organ or systems involvement   Current Dose per office note on 05/24/2023: Methotrexate  0.6 ML subcutaneous injections every week   Okay to refill Syringes?

## 2023-09-10 ENCOUNTER — Other Ambulatory Visit: Payer: Self-pay

## 2023-09-10 MED ORDER — "BD TB SYRINGE 27G X 1/2"" 1 ML MISC"
3 refills | Status: AC
  Filled 2023-09-10: qty 12, 90d supply, fill #0
  Filled 2023-12-09: qty 12, 90d supply, fill #1

## 2023-10-10 ENCOUNTER — Telehealth: Payer: Self-pay | Admitting: Rheumatology

## 2023-10-10 ENCOUNTER — Encounter: Payer: Self-pay | Admitting: Rheumatology

## 2023-10-10 NOTE — Telephone Encounter (Signed)
VOB submitted for Orthovisc, Left knee(s) BV pending 

## 2023-10-11 NOTE — Telephone Encounter (Signed)
 Please call to schedule visco injections.  Approved for Orthovisc, Left knee(s). [employer] Deductible does not apply $40 co-pay Once the OOP has been met $4000 (met $60.18) patient is covered at 100% Prior authorization is not required Confirmed with Sharene A  Ref #[id number]

## 2023-10-25 NOTE — Progress Notes (Unsigned)
 Office Visit Note  Patient: Debbie Williams             Date of Birth: 05-13-1965           MRN: [id number]             PCP: Pcp, No Referring: No ref. provider found Visit Date: 11/08/2023 Occupation: @GUAROCC @  Subjective:  Medication monitoring   History of Present Illness: Debbie Williams is a 58 y.o. female with history of seropositive rheumatoid arthritis and osteoarthritis.  Patient remains on Methotrexate  0.8 ML subcutaneous injections every week and folic acid 2 mg by mouth daily.  The dose of methotrexate  was increased from 0.6 to 0.8 ml after her last office visit on 05/24/2023 at which time she was experiencing increased pain and inflammation involving multiple joints.  Patient states that she has tolerated the increased dose of methotrexate  and has not had any recent gaps in therapy.  Patient states that after increasing the dose her symptoms subsided and she has not had any signs of a flare.  Patient states that with her most recent refill the dose written for 0.6 ml sq weekly injections and she was unsure why the dose was reduced again.  Her most recent dose of methotrexate  was administered on Monday. Patient plans on receiving the second Orthovisc injection of the series today.  She did well after the first dose for her left knee. She denies any recent or recurrent infections. She denies any new medical conditions.   Activities of Daily Living:  Patient denies morning stiffness Patient Denies nocturnal pain.  Difficulty dressing/grooming: Denies Difficulty climbing stairs: Denies Difficulty getting out of chair: Denies Difficulty using hands for taps, buttons, cutlery, and/or writing: Denies  Review of Systems  Constitutional:  Negative for fatigue.  HENT:  Negative for mouth sores, mouth dryness and nose dryness.   Eyes:  Negative for pain, visual disturbance and dryness.  Respiratory:  Negative for cough, hemoptysis, shortness of breath and  difficulty breathing.   Cardiovascular:  Negative for chest pain, palpitations, hypertension and swelling in legs/feet.  Gastrointestinal:  Negative for blood in stool, constipation and diarrhea.  Endocrine: Negative for increased urination.  Genitourinary:  Negative for painful urination.  Musculoskeletal:  Positive for joint pain and joint pain. Negative for joint swelling, myalgias, muscle weakness, morning stiffness, muscle tenderness and myalgias.  Skin:  Negative for color change, pallor, rash, hair loss, nodules/bumps, skin tightness, ulcers and sensitivity to sunlight.  Allergic/Immunologic: Negative for susceptible to infections.  Neurological:  Negative for dizziness, numbness, headaches and weakness.  Hematological:  Negative for swollen glands.  Psychiatric/Behavioral:  Negative for depressed mood and sleep disturbance. The patient is not nervous/anxious.     PMFS History:  Patient Active Problem List   Diagnosis Date Noted   Other specified personal risk factors, not elsewhere classified 02/01/2023   Well woman exam with routine gynecological exam 02/01/2023   High risk medication use 03/21/2016   Anxiety 03/21/2016   Elevated blood pressure 10/12/2014   Allergic rhinitis due to pollen 10/12/2014   Rheumatoid arthritis with rheumatoid factor of multiple sites without organ or systems involvement (HCC) 09/18/2014   GAD (generalized anxiety disorder) 09/18/2014   Left knee pain 09/18/2014   Hyperglycemia 09/18/2014   BMI 36.0-36.9,adult 09/18/2014    Past Medical History:  Diagnosis Date   Anxiety    Arthritis    Rheumatoid   Depression    Diabetes mellitus without complication (HCC)     Family History  Problem Relation Age of Onset   Hypertension Mother    Arthritis Mother    Diabetes Maternal Grandmother    Heart disease Maternal Grandmother    Arthritis Maternal Grandmother    Breast cancer Neg Hx    Ovarian cancer Neg Hx    Past Surgical History:   Procedure Laterality Date   INTRAUTERINE DEVICE INSERTION  09/2012   Mirena    Social History   Social History Narrative   Separated from her husband, though they share a home for now.   Their daughter lives with them.   Immunization History  Administered Date(s) Administered   Influenza,inj,Quad PF,6+ Mos 02/10/2015   Moderna Sars-Covid-2 Vaccination 04/04/2020   PFIZER Comirnaty(Gray Top)Covid-19 Tri-Sucrose Vaccine 06/26/2019, 07/24/2019   Pneumococcal Polysaccharide-23 09/18/2014   Tdap 09/18/2014   Zoster, Live 01/14/2015     Objective: Vital Signs: BP (!) 191/84 (BP Location: Right Arm, Patient Position: Sitting, Cuff Size: Large)   Pulse 98   Resp 14   Ht 5' 7 (1.702 m)   Wt 220 lb (99.8 kg)   LMP 09/01/2011   BMI 34.46 kg/m    Physical Exam Vitals and nursing note reviewed.  Constitutional:      Appearance: She is well-developed.  HENT:     Head: Normocephalic and atraumatic.  Eyes:     Conjunctiva/sclera: Conjunctivae normal.  Cardiovascular:     Rate and Rhythm: Normal rate and regular rhythm.     Heart sounds: Normal heart sounds.  Pulmonary:     Effort: Pulmonary effort is normal.     Breath sounds: Normal breath sounds.  Abdominal:     General: Bowel sounds are normal.     Palpations: Abdomen is soft.  Musculoskeletal:     Cervical back: Normal range of motion.  Skin:    General: Skin is warm and dry.     Capillary Refill: Capillary refill takes less than 2 seconds.  Neurological:     Mental Status: She is alert and oriented to person, place, and time.  Psychiatric:        Behavior: Behavior normal.      Musculoskeletal Exam: C-spine, thoracic spine, lumbar spine good range of motion.  Shoulder joints, elbow joints, wrist joints, MCPs, PIPs, DIPs have good range of motion with no synovitis.  Complete fist formation bilaterally.  Hip joints have good range of motion with no groin pain.  Knee joints have good range of motion no warmth or  effusion.  Ankle joints have good range of motion with no tenderness or joint swelling.  No tenderness or swelling of ankle joints.  CDAI Exam: CDAI Score: -- Patient Global: --; Provider Global: -- Swollen: --; Tender: -- Joint Exam 11/08/2023   No joint exam has been documented for this visit   There is currently no information documented on the homunculus. Go to the Rheumatology activity and complete the homunculus joint exam.  Investigation: No additional findings.  Imaging: No results found.  Recent Labs: Lab Results  Component Value Date   WBC 5.2 11/01/2023   HGB 12.0 11/01/2023   PLT 296 11/01/2023   NA 140 11/01/2023   K 4.2 11/01/2023   CL 105 11/01/2023   CO2 25 11/01/2023   GLUCOSE 130 (H) 11/01/2023   BUN 15 11/01/2023   CREATININE 0.83 11/01/2023   BILITOT 0.8 11/01/2023   ALKPHOS 56 08/18/2016   AST 22 11/01/2023   ALT 23 11/01/2023   PROT 8.1 11/01/2023   ALBUMIN 4.3 08/18/2016   CALCIUM  9.9 11/01/2023   GFRAA 92 04/08/2020    Speciality Comments: No specialty comments available.  Procedures:  No procedures performed Allergies: Grass extracts [gramineae pollens] and Pollen extract   Assessment / Plan:     Visit Diagnoses: Rheumatoid arthritis with rheumatoid factor of multiple sites without organ or systems involvement (HCC) - positive RF, positive anti-CCP, elevated sed rate, erosive disease: She has no synovitis on examination today.  She has noticed a significant improvement in her symptoms since increasing methotrexate  from 0.6 mL sq injections once weekly to 0.8 mL sq injections once weekly after her last office visit on 05/24/2023.  She has been tolerating the increased dose of methotrexate  without any side effects or injection site reactions.  She has not had any signs or symptoms of a flare on the increased dose.  No morning stiffness, nocturnal pain, or difficulty performing ADLs.  Patient would like to reduce  methotrexate  to 0.7 mL sq  injections once weekly due to the concern for long-term adverse effects on the increased dose of methotrexate -prescription will be changed today.  She will remain on folic acid 2 mg daily. She will notify us  if she develops any new or worsening symptoms.  She will follow up in 5 months or sooner if needed.   High risk medication use - Methotrexate  0.7 ML subcutaneous injections every week and folic acid 2 mg by mouth daily CBC and CMP updated on 11/01/23.  Her next lab work will be due in October and every 3 months to monitor for drug toxicity.  Standing orders for CBC and CMP are placed today. No recent or recurrent infections.  Discussed the importance of holding methotrexate  if she develops signs or symptoms of an infection and to resume once the infection has completely cleared.  No new medical conditions.   Elevated LFTs: LFTs within normal limits on 11/01/2023.  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  She did well after the first injection of the series last week.  She has not noticed any warmth or swelling.  No effusion noted.  X-rays of the left knee were obtained today to assess for radiographic progression.   She tolerated procedure well.  Procedure note was completed above.  Aftercare was discussed.  She is planning to return for the third orthovisc injection of the series next week.   plan: XR KNEE 3 VIEW LEFT  Elevated blood pressure reading: Blood pressure was elevated today while in the office and it was rechecked prior to leaving.  Patient was advised to monitor blood pressure closely and to reach out to PCP if her blood pressure remains elevated.   History of anxiety  Orders: Orders Placed This Encounter  Procedures   Large Joint Inj: L knee   XR KNEE 3 VIEW LEFT   CBC with Differential/Platelet   Comprehensive metabolic panel with GFR   No orders of the defined types were placed in  this encounter.    Follow-Up Instructions: Return in about 5 months (around 04/09/2024) for Rheumatoid arthritis, Osteoarthritis.   Waddell CHRISTELLA Craze, PA-C  Note - This record has been created using Dragon software.  Chart creation errors have been sought, but may not always  have been located. Such creation errors do not reflect on  the standard of medical care.

## 2023-10-27 ENCOUNTER — Other Ambulatory Visit: Payer: Self-pay | Admitting: Physician Assistant

## 2023-10-27 NOTE — Telephone Encounter (Signed)
 Last Fill: 10/10/2022  Next Visit: 11/08/2023  Last Visit: 05/24/2023  Dx: Rheumatoid arthritis with rheumatoid factor of multiple sites without organ or systems involvement   Current Dose per office note on 05/24/2023: folic acid 2 mg by mouth daily   Okay to refill Folic Acid?

## 2023-10-31 ENCOUNTER — Other Ambulatory Visit (HOSPITAL_COMMUNITY): Payer: Self-pay

## 2023-11-01 ENCOUNTER — Ambulatory Visit: Payer: Self-pay | Attending: Physician Assistant | Admitting: Physician Assistant

## 2023-11-01 DIAGNOSIS — Z79899 Other long term (current) drug therapy: Secondary | ICD-10-CM

## 2023-11-01 DIAGNOSIS — M1712 Unilateral primary osteoarthritis, left knee: Secondary | ICD-10-CM

## 2023-11-01 MED ORDER — HYALURONAN 30 MG/2ML IX SOSY
30.0000 mg | PREFILLED_SYRINGE | INTRA_ARTICULAR | Status: AC | PRN
Start: 2023-11-01 — End: 2023-11-01
  Administered 2023-11-01: 30 mg via INTRA_ARTICULAR

## 2023-11-01 MED ORDER — LIDOCAINE HCL 1 % IJ SOLN
1.5000 mL | INTRAMUSCULAR | Status: AC | PRN
  Administered 2023-11-01: 1.5 mL

## 2023-11-01 NOTE — Progress Notes (Signed)
   Procedure Note  Patient: Debbie Williams             Date of Birth: 08-17-1965           MRN: [id number]             Visit Date: 11/01/2023  Procedures: Visit Diagnoses:   #1 Orthovisc Left Knee B/B  1. High risk medication use     Large Joint Inj: L knee on 11/01/2023 1:50 PM Indications: pain Details: 25 G 1.5 in needle, medial approach  Arthrogram: No  Medications: 1.5 mL lidocaine  1 %; 30 mg Hyaluronan 30 MG/2ML Aspirate: 0 mL Outcome: tolerated well, no immediate complications Procedure, treatment alternatives, risks and benefits explained, specific risks discussed. Consent was given by the patient.     Patient tolerated the procedure well.  Aftercare was discussed.  Waddell Craze, PA-C

## 2023-11-02 ENCOUNTER — Other Ambulatory Visit (HOSPITAL_BASED_OUTPATIENT_CLINIC_OR_DEPARTMENT_OTHER): Payer: Self-pay

## 2023-11-02 ENCOUNTER — Ambulatory Visit: Payer: Self-pay | Admitting: Rheumatology

## 2023-11-02 ENCOUNTER — Other Ambulatory Visit: Payer: Self-pay | Admitting: Physician Assistant

## 2023-11-02 LAB — CBC WITH DIFFERENTIAL/PLATELET
Absolute Lymphocytes: 2813 {cells}/uL (ref 850–3900)
Absolute Monocytes: 421 {cells}/uL (ref 200–950)
Basophils Absolute: 42 {cells}/uL (ref 0–200)
Basophils Relative: 0.8 %
Eosinophils Absolute: 161 {cells}/uL (ref 15–500)
Eosinophils Relative: 3.1 %
HCT: 37.1 % (ref 35.0–45.0)
Hemoglobin: 12 g/dL (ref 11.7–15.5)
MCH: 27.8 pg (ref 27.0–33.0)
MCHC: 32.3 g/dL (ref 32.0–36.0)
MCV: 85.9 fL (ref 80.0–100.0)
MPV: 11.2 fL (ref 7.5–12.5)
Monocytes Relative: 8.1 %
Neutro Abs: 1763 {cells}/uL (ref 1500–7800)
Neutrophils Relative %: 33.9 %
Platelets: 296 Thousand/uL (ref 140–400)
RBC: 4.32 Million/uL (ref 3.80–5.10)
RDW: 12.4 % (ref 11.0–15.0)
Total Lymphocyte: 54.1 %
WBC: 5.2 Thousand/uL (ref 3.8–10.8)

## 2023-11-02 LAB — COMPLETE METABOLIC PANEL WITHOUT GFR
AG Ratio: 1.4 (calc) (ref 1.0–2.5)
ALT: 23 U/L (ref 6–29)
AST: 22 U/L (ref 10–35)
Albumin: 4.7 g/dL (ref 3.6–5.1)
Alkaline phosphatase (APISO): 75 U/L (ref 37–153)
BUN: 15 mg/dL (ref 7–25)
CO2: 25 mmol/L (ref 20–32)
Calcium: 9.9 mg/dL (ref 8.6–10.4)
Chloride: 105 mmol/L (ref 98–110)
Creat: 0.83 mg/dL (ref 0.50–1.03)
Globulin: 3.4 g/dL (ref 1.9–3.7)
Glucose, Bld: 130 mg/dL — ABNORMAL HIGH (ref 65–99)
Potassium: 4.2 mmol/L (ref 3.5–5.3)
Sodium: 140 mmol/L (ref 135–146)
Total Bilirubin: 0.8 mg/dL (ref 0.2–1.2)
Total Protein: 8.1 g/dL (ref 6.1–8.1)

## 2023-11-02 MED ORDER — METHOTREXATE SODIUM CHEMO INJECTION 50 MG/2ML
15.0000 mg | INTRAMUSCULAR | 0 refills | Status: DC
  Filled 2023-11-02: qty 8, 90d supply, fill #0

## 2023-11-02 NOTE — Progress Notes (Signed)
 CBC and CMP are normal.  Glucose is elevated most likely not a fasting sample.

## 2023-11-02 NOTE — Telephone Encounter (Signed)
 Last Fill: 01/27/2023  Labs: 11/01/2023 Glucose 130  Next Visit: 11/08/2023  Last Visit: 05/24/2023  DX: Rheumatoid arthritis with rheumatoid factor of multiple sites without organ or systems involvement   Current Dose per office note 05/24/2023: Methotrexate  0.6 ML subcutaneous injections every week   Okay to refill Methotrexate ?

## 2023-11-08 ENCOUNTER — Ambulatory Visit: Payer: Medicare PPO | Attending: Physician Assistant | Admitting: Physician Assistant

## 2023-11-08 ENCOUNTER — Encounter: Payer: Self-pay | Admitting: Physician Assistant

## 2023-11-08 ENCOUNTER — Ambulatory Visit (INDEPENDENT_AMBULATORY_CARE_PROVIDER_SITE_OTHER)

## 2023-11-08 VITALS — BP 175/76 | HR 101 | Resp 14 | Ht 67.0 in | Wt 220.0 lb

## 2023-11-08 DIAGNOSIS — R7989 Other specified abnormal findings of blood chemistry: Secondary | ICD-10-CM | POA: Diagnosis not present

## 2023-11-08 DIAGNOSIS — Z8659 Personal history of other mental and behavioral disorders: Secondary | ICD-10-CM

## 2023-11-08 DIAGNOSIS — R030 Elevated blood-pressure reading, without diagnosis of hypertension: Secondary | ICD-10-CM

## 2023-11-08 DIAGNOSIS — Z79899 Other long term (current) drug therapy: Secondary | ICD-10-CM | POA: Diagnosis not present

## 2023-11-08 DIAGNOSIS — M1712 Unilateral primary osteoarthritis, left knee: Secondary | ICD-10-CM

## 2023-11-08 DIAGNOSIS — M0579 Rheumatoid arthritis with rheumatoid factor of multiple sites without organ or systems involvement: Secondary | ICD-10-CM

## 2023-11-08 MED ORDER — METHOTREXATE SODIUM CHEMO INJECTION 50 MG/2ML: 17.5000 mg | INTRAMUSCULAR | Status: DC

## 2023-11-08 MED ORDER — HYALURONAN 30 MG/2ML IX SOSY
30.0000 mg | PREFILLED_SYRINGE | INTRA_ARTICULAR | Status: AC | PRN
  Administered 2023-11-08: 30 mg via INTRA_ARTICULAR

## 2023-11-08 MED ORDER — LIDOCAINE HCL 1 % IJ SOLN
1.5000 mL | INTRAMUSCULAR | Status: AC | PRN
  Administered 2023-11-08: 1.5 mL

## 2023-11-08 NOTE — Patient Instructions (Signed)
 Standing Labs We placed an order today for your standing lab work.   Please have your standing labs drawn in October and every 3 months   Please have your labs drawn 2 weeks prior to your appointment so that the provider can discuss your lab results at your appointment, if possible.  Please note that you may see your imaging and lab results in MyChart before we have reviewed them. We will contact you once all results are reviewed. Please allow our office up to 72 hours to thoroughly review all of the results before contacting the office for clarification of your results.  WALK-IN LAB HOURS  Monday through Thursday from 8:00 am -12:30 pm and 1:00 pm-4:30 pm and Friday from 8:00 am-12:00 pm.  Patients with office visits requiring labs will be seen before walk-in labs.  You may encounter longer than normal wait times. Please allow additional time. Wait times may be shorter on  Monday and Thursday afternoons.  We do not book appointments for walk-in labs. We appreciate your patience and understanding with our staff.   Labs are drawn by [facility]. Please bring your co-pay at the time of your lab draw.  You may receive a bill from [facility] for your lab work.  Please note if you are on Hydroxychloroquine and and an order has been placed for a Hydroxychloroquine level,  you will need to have it drawn 4 hours or more after your last dose.  If you wish to have your labs drawn at another location, please call the office 24 hours in advance so we can fax the orders.  The office is located at [address], [place], [place] [address]   If you have any questions regarding directions or hours of operation,  please call [phone].   As a reminder, please drink plenty of water  prior to coming for your lab work. Thanks!

## 2023-11-08 NOTE — Progress Notes (Signed)
   Procedure Note  Patient: Debbie Williams             Date of Birth: Sep 27, 1965           MRN: [id number]             Visit Date: 11/08/2023  Procedures: Visit Diagnoses:   #2 Orthovisc Left knee B/B 1. Rheumatoid arthritis with rheumatoid factor of multiple sites without organ or systems involvement (HCC)   2. High risk medication use   3. Elevated LFTs   4. 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 History of anxiety     Large Joint Inj: L knee on 11/08/2023 3:24 PM Indications: pain Details: 25 G 1.5 in needle, medial approach  Arthrogram: No  Medications: 1.5 mL lidocaine  1 %; 30 mg Hyaluronan 30 MG/2ML Aspirate: 0 mL Outcome: tolerated well, no immediate complications Procedure, treatment alternatives, risks and benefits explained, specific risks discussed. Consent was given by the patient.    She tolerated procedure well.  Procedure was completed above.  Aftercare was discussed. Waddell Craze, PA-C

## 2023-11-09 ENCOUNTER — Ambulatory Visit: Payer: Self-pay | Admitting: Physician Assistant

## 2023-11-09 NOTE — Progress Notes (Signed)
 X-rays are consistent with severe osteoarthritis and moderate chondromalacia patella.  Radiographic progression was noted when compared to x-rays from 2019. Please notify the patient.  Recommend continuing viscosupplementation series at this time

## 2023-11-15 ENCOUNTER — Ambulatory Visit: Attending: Physician Assistant | Admitting: Physician Assistant

## 2023-11-15 DIAGNOSIS — M1712 Unilateral primary osteoarthritis, left knee: Secondary | ICD-10-CM | POA: Diagnosis not present

## 2023-11-15 MED ORDER — HYALURONAN 30 MG/2ML IX SOSY
30.0000 mg | PREFILLED_SYRINGE | INTRA_ARTICULAR | Status: AC | PRN
  Administered 2023-11-15: 30 mg via INTRA_ARTICULAR

## 2023-11-15 MED ORDER — LIDOCAINE HCL 1 % IJ SOLN
1.5000 mL | INTRAMUSCULAR | Status: AC | PRN
  Administered 2023-11-15: 1.5 mL

## 2023-11-15 NOTE — Progress Notes (Signed)
   Procedure Note  Patient: Debbie Williams             Date of Birth: 1965/06/20           MRN: [id number]             Visit Date: 11/15/2023  Procedures:  #3 Orthovisc Left Knee B/B Visit Diagnoses:  1. 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     Patient tolerated procedure well.  Procedure note was completed above.  Aftercare was discussed. Waddell Craze, PA-C

## 2023-12-15 ENCOUNTER — Other Ambulatory Visit (HOSPITAL_COMMUNITY): Payer: Self-pay

## 2024-01-08 ENCOUNTER — Encounter: Payer: Self-pay | Admitting: Rheumatology

## 2024-01-08 ENCOUNTER — Other Ambulatory Visit: Payer: Self-pay | Admitting: *Deleted

## 2024-01-08 ENCOUNTER — Other Ambulatory Visit (HOSPITAL_BASED_OUTPATIENT_CLINIC_OR_DEPARTMENT_OTHER): Payer: Self-pay

## 2024-01-08 MED ORDER — METHOTREXATE SODIUM CHEMO INJECTION 50 MG/2ML
17.5000 mg | INTRAMUSCULAR | 0 refills | Status: AC
  Filled 2024-01-08: qty 10, 90d supply, fill #0

## 2024-01-08 NOTE — Telephone Encounter (Signed)
 Last Fill: 11/08/2023  Labs: 11/01/2023 CBC and CMP are normal. Glucose is elevated most likely not a fasting sample.   Next Visit: 04/23/2024  Last Visit: 11/08/2023   DX:  Rheumatoid arthritis with rheumatoid factor of multiple sites without organ or systems involvement   Current Dose per office note 11/08/2023: Methotrexate  0.7 ML subcutaneous injections every week   Okay to refill Methotrexate ?

## 2024-01-10 ENCOUNTER — Other Ambulatory Visit (HOSPITAL_BASED_OUTPATIENT_CLINIC_OR_DEPARTMENT_OTHER): Payer: Self-pay

## 2024-04-09 NOTE — Progress Notes (Deleted)
 "  Office Visit Note  Patient: Debbie Williams             Date of Birth: 05-05-1965           MRN: [id number]             PCP: Pcp, No Referring: No ref. provider found Visit Date: 04/23/2024 Occupation: Data Unavailable  Subjective:  No chief complaint on file.   History of Present Illness: Debbie Williams is a 58 y.o. female ***     Activities of Daily Living:  Patient reports morning stiffness for *** {minute/hour:19697}.   Patient {ACTIONS;DENIES/REPORTS:21021675::Denies} nocturnal pain.  Difficulty dressing/grooming: {ACTIONS;DENIES/REPORTS:21021675::Denies} Difficulty climbing stairs: {ACTIONS;DENIES/REPORTS:21021675::Denies} Difficulty getting out of chair: {ACTIONS;DENIES/REPORTS:21021675::Denies} Difficulty using hands for taps, buttons, cutlery, and/or writing: {ACTIONS;DENIES/REPORTS:21021675::Denies}  No Rheumatology ROS completed.   PMFS History:  Patient Active Problem List   Diagnosis Date Noted   Other specified personal risk factors, not elsewhere classified 02/01/2023   Well woman exam with routine gynecological exam 02/01/2023   High risk medication use 03/21/2016   Anxiety 03/21/2016   Elevated blood pressure 10/12/2014   Allergic rhinitis due to pollen 10/12/2014   Rheumatoid arthritis with rheumatoid factor of multiple sites without organ or systems involvement (HCC) 09/18/2014   GAD (generalized anxiety disorder) 09/18/2014   Left knee pain 09/18/2014   Hyperglycemia 09/18/2014   BMI 36.0-36.9,adult 09/18/2014    Past Medical History:  Diagnosis Date   Anxiety    Arthritis    Rheumatoid   Depression    Diabetes mellitus without complication (HCC)     Family History  Problem Relation Age of Onset   Hypertension Mother    Arthritis Mother    Diabetes Maternal Grandmother    Heart disease Maternal Grandmother    Arthritis Maternal Grandmother    Breast cancer Neg Hx    Ovarian cancer Neg Hx    Past Surgical  History:  Procedure Laterality Date   INTRAUTERINE DEVICE INSERTION  09/2012   Mirena    Social History[1] Social History   Social History Narrative   Separated from her husband, though they share a home for now.   Their daughter lives with them.     Immunization History  Administered Date(s) Administered   Influenza,inj,Quad PF,6+ Mos 02/10/2015   Moderna Sars-Covid-2 Vaccination 04/04/2020   PFIZER Comirnaty(Gray Top)Covid-19 Tri-Sucrose Vaccine 06/26/2019, 07/24/2019   Pneumococcal Polysaccharide-23 09/18/2014   Tdap 09/18/2014   Zoster, Live 01/14/2015     Objective: Vital Signs: LMP 09/01/2011    Physical Exam   Musculoskeletal Exam: ***  CDAI Exam: CDAI Score: -- Patient Global: --; Provider Global: -- Swollen: --; Tender: -- Joint Exam 04/23/2024   No joint exam has been documented for this visit   There is currently no information documented on the homunculus. Go to the Rheumatology activity and complete the homunculus joint exam.  Investigation: No additional findings.  Imaging: No results found.  Recent Labs: Lab Results  Component Value Date   WBC 5.2 11/01/2023   HGB 12.0 11/01/2023   PLT 296 11/01/2023   NA 140 11/01/2023   K 4.2 11/01/2023   CL 105 11/01/2023   CO2 25 11/01/2023   GLUCOSE 130 (H) 11/01/2023   BUN 15 11/01/2023   CREATININE 0.83 11/01/2023   BILITOT 0.8 11/01/2023   ALKPHOS 56 08/18/2016   AST 22 11/01/2023   ALT 23 11/01/2023   PROT 8.1 11/01/2023   ALBUMIN 4.3 08/18/2016   CALCIUM 9.9 11/01/2023   GFRAA 92 04/08/2020  Speciality Comments: No specialty comments available.  Procedures:  No procedures performed Allergies: Grass extracts [gramineae pollens] and Pollen extract   Assessment / Plan:     Visit Diagnoses: No diagnosis found.  Orders: No orders of the defined types were placed in this encounter.  No orders of the defined types were placed in this encounter.   Face-to-face time spent with  patient was *** minutes. Greater than 50% of time was spent in counseling and coordination of care.  Follow-Up Instructions: No follow-ups on file.   Daved JAYSON Gavel, CMA  Note - This record has been created using Animal nutritionist.  Chart creation errors have been sought, but may not always  have been located. Such creation errors do not reflect on  the standard of medical care.    [1]  Social History Tobacco Use   Smoking status: Never    Passive exposure: Never   Smokeless tobacco: Never  Vaping Use   Vaping status: Never Used  Substance Use Topics   Alcohol use: Yes    Alcohol/week: 0.0 standard drinks of alcohol    Comment: occ   Drug use: No   "

## 2024-04-23 ENCOUNTER — Ambulatory Visit: Admitting: Rheumatology

## 2024-04-23 DIAGNOSIS — Z79899 Other long term (current) drug therapy: Secondary | ICD-10-CM

## 2024-04-23 DIAGNOSIS — M0579 Rheumatoid arthritis with rheumatoid factor of multiple sites without organ or systems involvement: Secondary | ICD-10-CM

## 2024-04-23 DIAGNOSIS — M1712 Unilateral primary osteoarthritis, left knee: Secondary | ICD-10-CM

## 2024-04-23 DIAGNOSIS — Z8659 Personal history of other mental and behavioral disorders: Secondary | ICD-10-CM

## 2024-06-11 ENCOUNTER — Ambulatory Visit: Admitting: Physician Assistant
